# Patient Record
Sex: Female | Born: 1970 | Race: Black or African American | Hispanic: No | Marital: Married | Smoking: Current every day smoker
Health system: Southern US, Community
[De-identification: ages and names within clinical notes are randomized; demographics above are authoritative.]

## PROBLEM LIST (undated history)

## (undated) DIAGNOSIS — D649 Anemia, unspecified: Secondary | ICD-10-CM

## (undated) DIAGNOSIS — G43909 Migraine, unspecified, not intractable, without status migrainosus: Secondary | ICD-10-CM

## (undated) DIAGNOSIS — K219 Gastro-esophageal reflux disease without esophagitis: Secondary | ICD-10-CM

## (undated) DIAGNOSIS — Z973 Presence of spectacles and contact lenses: Secondary | ICD-10-CM

## (undated) DIAGNOSIS — R011 Cardiac murmur, unspecified: Secondary | ICD-10-CM

## (undated) DIAGNOSIS — I10 Essential (primary) hypertension: Secondary | ICD-10-CM

## (undated) DIAGNOSIS — K561 Intussusception: Secondary | ICD-10-CM

## (undated) DIAGNOSIS — E119 Type 2 diabetes mellitus without complications: Secondary | ICD-10-CM

## (undated) DIAGNOSIS — G4730 Sleep apnea, unspecified: Secondary | ICD-10-CM

## (undated) DIAGNOSIS — F419 Anxiety disorder, unspecified: Secondary | ICD-10-CM

## (undated) HISTORY — DX: Anxiety disorder, unspecified: F41.9

## (undated) HISTORY — PX: CHOLECYSTECTOMY: SHX55

## (undated) HISTORY — PX: TUBAL LIGATION: SHX77

## (undated) HISTORY — PX: APPENDECTOMY: SHX54

## (undated) HISTORY — DX: Cardiac murmur, unspecified: R01.1

---

## 2000-11-29 ENCOUNTER — Encounter: Payer: Self-pay | Admitting: Emergency Medicine

## 2000-11-29 ENCOUNTER — Emergency Department (HOSPITAL_COMMUNITY): Admission: EM | Admit: 2000-11-29 | Discharge: 2000-11-29 | Payer: Self-pay | Admitting: Emergency Medicine

## 2001-01-31 ENCOUNTER — Encounter: Admission: RE | Admit: 2001-01-31 | Discharge: 2001-02-21 | Payer: Self-pay | Admitting: *Deleted

## 2004-11-18 ENCOUNTER — Ambulatory Visit: Payer: Self-pay | Admitting: Gastroenterology

## 2005-03-08 ENCOUNTER — Ambulatory Visit: Payer: Self-pay | Admitting: Family Medicine

## 2005-07-18 ENCOUNTER — Ambulatory Visit: Payer: Self-pay | Admitting: Gastroenterology

## 2006-01-11 ENCOUNTER — Ambulatory Visit: Payer: Self-pay | Admitting: Oncology

## 2006-01-30 LAB — CBC WITH DIFFERENTIAL (CANCER CENTER ONLY)
BASO#: 0.1 10*3/uL (ref 0.0–0.2)
EOS%: 2.2 % (ref 0.0–7.0)
HCT: 33.5 % — ABNORMAL LOW (ref 34.8–46.6)
HGB: 11 g/dL — ABNORMAL LOW (ref 11.6–15.9)
MCH: 24 pg — ABNORMAL LOW (ref 26.0–34.0)
MCHC: 32.9 g/dL (ref 32.0–36.0)
MONO%: 3.9 % (ref 0.0–13.0)
NEUT%: 59.7 % (ref 39.6–80.0)

## 2006-01-30 LAB — LACTATE DEHYDROGENASE: LDH: 117 U/L (ref 94–250)

## 2006-01-30 LAB — FERRITIN: Ferritin: 6 ng/mL — ABNORMAL LOW (ref 10–291)

## 2006-01-30 LAB — MORPHOLOGY - CHCC SATELLITE: PLT EST ~~LOC~~: ADEQUATE

## 2006-01-30 LAB — COMPREHENSIVE METABOLIC PANEL
ALT: 12 U/L (ref 0–40)
Albumin: 3.7 g/dL (ref 3.5–5.2)
Chloride: 108 mEq/L (ref 96–112)
Total Protein: 6.3 g/dL (ref 6.0–8.3)

## 2006-01-30 LAB — IRON AND TIBC: Iron: 18 ug/dL — ABNORMAL LOW (ref 42–145)

## 2006-02-20 LAB — CBC WITH DIFFERENTIAL (CANCER CENTER ONLY)
BASO#: 0.1 10*3/uL (ref 0.0–0.2)
EOS%: 1.9 % (ref 0.0–7.0)
HCT: 35 % (ref 34.8–46.6)
HGB: 11 g/dL — ABNORMAL LOW (ref 11.6–15.9)
LYMPH#: 2.5 10*3/uL (ref 0.9–3.3)
LYMPH%: 24.6 % (ref 14.0–48.0)
MCHC: 31.5 g/dL — ABNORMAL LOW (ref 32.0–36.0)
MCV: 75 fL — ABNORMAL LOW (ref 81–101)
NEUT%: 68.9 % (ref 39.6–80.0)

## 2006-02-20 LAB — HEMOGLOBINOPATHY EVALUATION: Hgb F Quant: 0 % (ref 0.0–0.5)

## 2006-02-22 ENCOUNTER — Ambulatory Visit: Payer: Self-pay | Admitting: Gastroenterology

## 2006-02-26 ENCOUNTER — Ambulatory Visit: Payer: Self-pay | Admitting: Oncology

## 2006-03-13 LAB — CBC WITH DIFFERENTIAL (CANCER CENTER ONLY)
EOS%: 1.5 % (ref 0.0–7.0)
MCH: 25.2 pg — ABNORMAL LOW (ref 26.0–34.0)
MCHC: 32 g/dL (ref 32.0–36.0)
MONO%: 3.4 % (ref 0.0–13.0)
NEUT#: 6.3 10*3/uL (ref 1.5–6.5)
Platelets: 234 10*3/uL (ref 145–400)
RDW: 20.2 % — ABNORMAL HIGH (ref 10.5–14.6)

## 2006-03-13 LAB — IRON AND TIBC
%SAT: 21 % (ref 20–55)
Iron: 65 ug/dL (ref 42–145)
TIBC: 305 ug/dL (ref 250–470)
UIBC: 240 ug/dL

## 2006-03-13 LAB — FERRITIN: Ferritin: 121 ng/mL (ref 10–291)

## 2006-03-27 ENCOUNTER — Ambulatory Visit: Payer: Self-pay

## 2006-04-09 ENCOUNTER — Ambulatory Visit: Payer: Self-pay | Admitting: Oncology

## 2006-04-10 LAB — CBC WITH DIFFERENTIAL (CANCER CENTER ONLY)
BASO#: 0 10*3/uL (ref 0.0–0.2)
BASO%: 0.4 % (ref 0.0–2.0)
Eosinophils Absolute: 0.1 10*3/uL (ref 0.0–0.5)
HCT: 40.3 % (ref 34.8–46.6)
HGB: 12.9 g/dL (ref 11.6–15.9)
LYMPH#: 2.6 10*3/uL (ref 0.9–3.3)
MCV: 82 fL (ref 81–101)
MONO#: 0.3 10*3/uL (ref 0.1–0.9)
NEUT%: 59.9 % (ref 39.6–80.0)
RBC: 4.94 10*6/uL (ref 3.70–5.32)
RDW: 19.3 % — ABNORMAL HIGH (ref 10.5–14.6)
WBC: 7.6 10*3/uL (ref 3.9–10.0)

## 2006-04-11 LAB — FERRITIN: Ferritin: 76 ng/mL (ref 10–291)

## 2006-04-11 LAB — IRON AND TIBC: Iron: 92 ug/dL (ref 42–145)

## 2006-06-01 ENCOUNTER — Ambulatory Visit: Payer: Self-pay | Admitting: Oncology

## 2006-06-05 LAB — IRON AND TIBC
%SAT: 17 % — ABNORMAL LOW (ref 20–55)
Iron: 57 ug/dL (ref 42–145)
TIBC: 332 ug/dL (ref 250–470)
UIBC: 275 ug/dL

## 2006-06-05 LAB — CBC WITH DIFFERENTIAL (CANCER CENTER ONLY)
BASO%: 0.6 % (ref 0.0–2.0)
Eosinophils Absolute: 0.1 10*3/uL (ref 0.0–0.5)
LYMPH#: 2.7 10*3/uL (ref 0.9–3.3)
MONO#: 0.4 10*3/uL (ref 0.1–0.9)
NEUT#: 5.1 10*3/uL (ref 1.5–6.5)
Platelets: 235 10*3/uL (ref 145–400)
RBC: 4.78 10*6/uL (ref 3.70–5.32)
RDW: 13.1 % (ref 10.5–14.6)
WBC: 8.3 10*3/uL (ref 3.9–10.0)

## 2006-06-05 LAB — FERRITIN: Ferritin: 17 ng/mL (ref 10–291)

## 2006-06-28 ENCOUNTER — Ambulatory Visit: Payer: Self-pay

## 2006-08-27 ENCOUNTER — Ambulatory Visit: Payer: Self-pay | Admitting: Oncology

## 2006-08-28 LAB — COMPREHENSIVE METABOLIC PANEL
ALT: 14 U/L (ref 0–35)
AST: 14 U/L (ref 0–37)
Albumin: 4.2 g/dL (ref 3.5–5.2)
Alkaline Phosphatase: 61 U/L (ref 39–117)
BUN: 9 mg/dL (ref 6–23)
CO2: 20 mEq/L (ref 19–32)
Calcium: 8.8 mg/dL (ref 8.4–10.5)
Chloride: 110 mEq/L (ref 96–112)
Creatinine, Ser: 0.86 mg/dL (ref 0.40–1.20)
Glucose, Bld: 95 mg/dL (ref 70–99)
Potassium: 4.2 mEq/L (ref 3.5–5.3)
Sodium: 139 mEq/L (ref 135–145)
Total Bilirubin: 0.3 mg/dL (ref 0.3–1.2)
Total Protein: 6.6 g/dL (ref 6.0–8.3)

## 2006-08-28 LAB — FERRITIN: Ferritin: 31 ng/mL (ref 10–291)

## 2006-08-28 LAB — IRON AND TIBC
%SAT: 11 % — ABNORMAL LOW (ref 20–55)
Iron: 34 ug/dL — ABNORMAL LOW (ref 42–145)
TIBC: 296 ug/dL (ref 250–470)
UIBC: 262 ug/dL

## 2006-08-28 LAB — CBC WITH DIFFERENTIAL (CANCER CENTER ONLY)
BASO%: 0.7 % (ref 0.0–2.0)
Eosinophils Absolute: 0.1 10*3/uL (ref 0.0–0.5)
LYMPH%: 29.6 % (ref 14.0–48.0)
MCH: 28.3 pg (ref 26.0–34.0)
MCV: 85 fL (ref 81–101)
MONO%: 3.9 % (ref 0.0–13.0)
NEUT#: 5.6 10*3/uL (ref 1.5–6.5)
Platelets: 196 10*3/uL (ref 145–400)
RBC: 4.81 10*6/uL (ref 3.70–5.32)
RDW: 12.3 % (ref 10.5–14.6)
WBC: 8.8 10*3/uL (ref 3.9–10.0)

## 2006-09-05 DIAGNOSIS — D509 Iron deficiency anemia, unspecified: Secondary | ICD-10-CM | POA: Insufficient documentation

## 2006-09-05 DIAGNOSIS — K219 Gastro-esophageal reflux disease without esophagitis: Secondary | ICD-10-CM | POA: Insufficient documentation

## 2006-12-24 ENCOUNTER — Ambulatory Visit: Payer: Self-pay | Admitting: Oncology

## 2007-01-08 LAB — CBC WITH DIFFERENTIAL (CANCER CENTER ONLY)
Eosinophils Absolute: 0.1 10*3/uL (ref 0.0–0.5)
MCH: 30 pg (ref 26.0–34.0)
MONO#: 0.3 10*3/uL (ref 0.1–0.9)
MONO%: 3.1 % (ref 0.0–13.0)
NEUT#: 5.3 10*3/uL (ref 1.5–6.5)
Platelets: 186 10*3/uL (ref 145–400)
RBC: 4.8 10*6/uL (ref 3.70–5.32)
WBC: 8.4 10*3/uL (ref 3.9–10.0)

## 2007-01-08 LAB — IRON AND TIBC
%SAT: 28 % (ref 20–55)
Iron: 94 ug/dL (ref 42–145)

## 2007-04-01 ENCOUNTER — Ambulatory Visit: Payer: Self-pay | Admitting: Oncology

## 2007-04-26 LAB — CBC WITH DIFFERENTIAL (CANCER CENTER ONLY)
BASO#: 0 10*3/uL (ref 0.0–0.2)
EOS%: 1.5 % (ref 0.0–7.0)
Eosinophils Absolute: 0.1 10*3/uL (ref 0.0–0.5)
HCT: 43 % (ref 34.8–46.6)
HGB: 14.4 g/dL (ref 11.6–15.9)
MCH: 29.3 pg (ref 26.0–34.0)
MCHC: 33.4 g/dL (ref 32.0–36.0)
MCV: 88 fL (ref 81–101)
MONO%: 2.7 % (ref 0.0–13.0)
NEUT#: 5 10*3/uL (ref 1.5–6.5)
NEUT%: 63.1 % (ref 39.6–80.0)
RBC: 4.91 10*6/uL (ref 3.70–5.32)

## 2007-04-26 LAB — IRON AND TIBC
%SAT: 24 % (ref 20–55)
TIBC: 262 ug/dL (ref 250–470)
UIBC: 198 ug/dL

## 2007-04-26 LAB — FERRITIN: Ferritin: 74 ng/mL (ref 10–291)

## 2007-07-19 DIAGNOSIS — J309 Allergic rhinitis, unspecified: Secondary | ICD-10-CM | POA: Insufficient documentation

## 2007-10-21 ENCOUNTER — Ambulatory Visit: Payer: Self-pay | Admitting: Oncology

## 2008-03-26 ENCOUNTER — Emergency Department: Payer: Self-pay | Admitting: Emergency Medicine

## 2008-07-02 ENCOUNTER — Ambulatory Visit: Payer: Self-pay | Admitting: Gastroenterology

## 2008-07-28 ENCOUNTER — Emergency Department (HOSPITAL_COMMUNITY): Admission: EM | Admit: 2008-07-28 | Discharge: 2008-07-28 | Payer: Self-pay | Admitting: Emergency Medicine

## 2008-12-13 ENCOUNTER — Emergency Department (HOSPITAL_COMMUNITY): Admission: EM | Admit: 2008-12-13 | Discharge: 2008-12-13 | Payer: Self-pay | Admitting: Emergency Medicine

## 2010-02-02 ENCOUNTER — Ambulatory Visit: Payer: Self-pay | Admitting: Internal Medicine

## 2010-03-02 ENCOUNTER — Inpatient Hospital Stay: Payer: Self-pay | Admitting: Internal Medicine

## 2010-03-02 ENCOUNTER — Ambulatory Visit: Payer: Self-pay | Admitting: Cardiovascular Disease

## 2011-03-29 ENCOUNTER — Ambulatory Visit: Payer: Self-pay

## 2011-04-14 ENCOUNTER — Ambulatory Visit: Payer: Self-pay | Admitting: Internal Medicine

## 2012-06-14 ENCOUNTER — Ambulatory Visit: Payer: Self-pay | Admitting: Internal Medicine

## 2012-06-27 ENCOUNTER — Ambulatory Visit: Payer: Self-pay | Admitting: Internal Medicine

## 2012-11-25 ENCOUNTER — Ambulatory Visit: Payer: Self-pay | Admitting: Internal Medicine

## 2012-11-25 LAB — CREATININE, SERUM
Creatinine: 0.93 mg/dL (ref 0.60–1.30)
EGFR (African American): >60
EGFR (Non-African Amer.): >60

## 2013-07-23 ENCOUNTER — Ambulatory Visit: Payer: Self-pay

## 2013-09-12 ENCOUNTER — Ambulatory Visit: Payer: Self-pay | Admitting: Internal Medicine

## 2014-01-21 ENCOUNTER — Emergency Department: Payer: Self-pay

## 2014-01-21 LAB — BASIC METABOLIC PANEL
Anion Gap: 5 — ABNORMAL LOW (ref 7–16)
BUN: 10 mg/dL (ref 7–18)
Calcium, Total: 8.9 mg/dL (ref 8.5–10.1)
Chloride: 108 mmol/L — ABNORMAL HIGH (ref 98–107)
Co2: 25 mmol/L (ref 21–32)
Creatinine: 0.99 mg/dL (ref 0.60–1.30)
EGFR (African American): >60
EGFR (Non-African Amer.): >60
Glucose: 101 mg/dL — ABNORMAL HIGH (ref 65–99)
Osmolality: 275 (ref 275–301)
Potassium: 3.9 mmol/L (ref 3.5–5.1)
SODIUM: 138 mmol/L (ref 136–145)

## 2014-01-21 LAB — CBC
HCT: 42.8 % (ref 35.0–47.0)
HGB: 13.7 g/dL (ref 12.0–16.0)
MCH: 28.3 pg (ref 26.0–34.0)
MCHC: 31.9 g/dL — ABNORMAL LOW (ref 32.0–36.0)
MCV: 89 fL (ref 80–100)
Platelet: 199 10*3/uL (ref 150–440)
RBC: 4.82 10*6/uL (ref 3.80–5.20)
RDW: 13.5 % (ref 11.5–14.5)
WBC: 11.7 10*3/uL — ABNORMAL HIGH (ref 3.6–11.0)

## 2014-01-21 LAB — TROPONIN I: Troponin-I: <0.02 ng/mL

## 2014-01-27 ENCOUNTER — Ambulatory Visit: Payer: Self-pay | Admitting: Internal Medicine

## 2014-06-30 ENCOUNTER — Ambulatory Visit: Payer: Self-pay | Admitting: Internal Medicine

## 2014-10-02 ENCOUNTER — Emergency Department (HOSPITAL_COMMUNITY)
Admission: EM | Admit: 2014-10-02 | Discharge: 2014-10-02 | Disposition: A | Discharge status: Home / Self Care | Payer: BLUE CROSS/BLUE SHIELD | Attending: Emergency Medicine | Admitting: Emergency Medicine

## 2014-10-02 ENCOUNTER — Encounter (HOSPITAL_COMMUNITY): Payer: Self-pay

## 2014-10-02 ENCOUNTER — Emergency Department (HOSPITAL_COMMUNITY): Payer: BLUE CROSS/BLUE SHIELD

## 2014-10-02 DIAGNOSIS — M25512 Pain in left shoulder: Secondary | ICD-10-CM | POA: Diagnosis not present

## 2014-10-02 DIAGNOSIS — Z720 Tobacco use: Secondary | ICD-10-CM | POA: Insufficient documentation

## 2014-10-02 DIAGNOSIS — Z79899 Other long term (current) drug therapy: Secondary | ICD-10-CM | POA: Insufficient documentation

## 2014-10-02 DIAGNOSIS — K219 Gastro-esophageal reflux disease without esophagitis: Secondary | ICD-10-CM | POA: Diagnosis not present

## 2014-10-02 DIAGNOSIS — R202 Paresthesia of skin: Secondary | ICD-10-CM | POA: Diagnosis not present

## 2014-10-02 DIAGNOSIS — R9431 Abnormal electrocardiogram [ECG] [EKG]: Secondary | ICD-10-CM | POA: Diagnosis not present

## 2014-10-02 DIAGNOSIS — M542 Cervicalgia: Secondary | ICD-10-CM | POA: Diagnosis present

## 2014-10-02 HISTORY — DX: Gastro-esophageal reflux disease without esophagitis: K21.9

## 2014-10-02 HISTORY — DX: Intussusception: K56.1

## 2014-10-02 LAB — CBC WITH DIFFERENTIAL/PLATELET
BASOS ABS: 0 10*3/uL (ref 0.0–0.1)
Basophils Relative: 0 % (ref 0–1)
EOS ABS: 0.2 10*3/uL (ref 0.0–0.7)
Eosinophils Relative: 2 % (ref 0–5)
HEMATOCRIT: 41.3 % (ref 36.0–46.0)
HEMOGLOBIN: 13.2 g/dL (ref 12.0–15.0)
Lymphocytes Relative: 34 % (ref 12–46)
Lymphs Abs: 3 10*3/uL (ref 0.7–4.0)
MCH: 28.4 pg (ref 26.0–34.0)
MCHC: 32 g/dL (ref 30.0–36.0)
MCV: 88.8 fL (ref 78.0–100.0)
MONOS PCT: 5 % (ref 3–12)
Monocytes Absolute: 0.4 10*3/uL (ref 0.1–1.0)
NEUTROS ABS: 5.2 10*3/uL (ref 1.7–7.7)
Neutrophils Relative %: 59 % (ref 43–77)
Platelets: 187 10*3/uL (ref 150–400)
RBC: 4.65 MIL/uL (ref 3.87–5.11)
RDW: 13.4 % (ref 11.5–15.5)
WBC: 8.9 10*3/uL (ref 4.0–10.5)

## 2014-10-02 LAB — BASIC METABOLIC PANEL
Anion gap: 5 (ref 5–15)
BUN: 12 mg/dL (ref 6–23)
CO2: 23 mmol/L (ref 19–32)
Calcium: 8.7 mg/dL (ref 8.4–10.5)
Chloride: 111 mEq/L (ref 96–112)
Creatinine, Ser: 0.88 mg/dL (ref 0.50–1.10)
GFR calc Af Amer: >90 mL/min (ref >90)
GFR, EST NON AFRICAN AMERICAN: 78 mL/min — AB (ref >90)
Glucose, Bld: 109 mg/dL — ABNORMAL HIGH (ref 70–99)
Potassium: 3.8 mmol/L (ref 3.5–5.1)
Sodium: 139 mmol/L (ref 135–145)

## 2014-10-02 LAB — URINALYSIS, ROUTINE W REFLEX MICROSCOPIC
Bilirubin Urine: NEGATIVE
GLUCOSE, UA: NEGATIVE mg/dL
KETONES UR: NEGATIVE mg/dL
Leukocytes, UA: NEGATIVE
Nitrite: NEGATIVE
PH: 5.5 (ref 5.0–8.0)
Protein, ur: NEGATIVE mg/dL
Specific Gravity, Urine: >1.03 — ABNORMAL HIGH (ref 1.005–1.030)
UROBILINOGEN UA: 0.2 mg/dL (ref 0.0–1.0)

## 2014-10-02 LAB — URINE MICROSCOPIC-ADD ON

## 2014-10-02 LAB — TROPONIN I: Troponin I: <0.03 ng/mL (ref <0.031)

## 2014-10-02 NOTE — ED Provider Notes (Signed)
CSN: [id number]     Arrival date & time 10/02/14  [id number] History   First MD Initiated Contact with Patient 10/02/14 0359     Chief Complaint  Patient presents with  . Neck Pain  . Tingling     (Consider location/radiation/quality/duration/timing/severity/associated sxs/prior Treatment) HPI   Penny Myers is a 44 y.o. female who was awakened from sleep, about 2 hours ago, by left neck pain, which radiates to her left shoulder.  At the same time she noticed tingling in her left lower lip.  The tingling sensation also radiates to her left forehead, and left cheek.  She denies chest pain, weakness, dizziness.  She drove her vehicle here for evaluation.  She had a similar episode of discomfort both in her face, and her left shoulder, about 3 months ago.  She has had similar pain in her left shoulder for the last 2 days.  A friend of hers at work.  Recommended some shoulder exercises to do.  She has never had a comprehensive evaluation for the left shoulder pain.  She has never been told that she had a TIA or stroke.  She has not had any fever, chills, nausea, vomiting, weakness, or trouble walking.  There are no other known modifying factors.   Past Medical History  Diagnosis Date  . Acid reflux   . Intussusception of intestine    Past Surgical History  Procedure Laterality Date  . Cholecystectomy    . Appendectomy    . Tubal ligation     No family history on file. History  Substance Use Topics  . Smoking status: Current Every Day Smoker    Types: Cigarettes  . Smokeless tobacco: Not on file  . Alcohol Use: Yes   OB History    No data available     Review of Systems  All other systems reviewed and are negative.     Allergies  Review of patient's allergies indicates no known allergies.  Home Medications   Prior to Admission medications   Medication Sig Start Date End Date Taking? Authorizing Provider  ferrous fumarate-iron polysaccharide complex (TANDEM) 162-115.2 MG  CAPS Take 1 capsule by mouth daily with breakfast.   Yes Historical Provider, MD  omeprazole (PRILOSEC) 10 MG capsule Take 10 mg by mouth daily.   Yes Historical Provider, MD   BP 135/100 mmHg  Pulse 76  Temp(Src) 97.8 F (36.6 C) (Oral)  Resp 20  Ht 5\' 4"  (1.626 m)  Wt 201 lb (91.173 kg)  BMI 34.48 kg/m2  SpO2 98%  LMP 09/18/2014 Physical Exam  Constitutional: She is oriented to person, place, and time. She appears well-developed and well-nourished.  HENT:  Head: Normocephalic and atraumatic.  Right Ear: External ear normal.  Left Ear: External ear normal.  Eyes: Conjunctivae and EOM are normal. Pupils are equal, round, and reactive to light.  Neck: Normal range of motion and phonation normal. Neck supple.  Cardiovascular: Normal rate, regular rhythm and normal heart sounds.   Pulmonary/Chest: Effort normal and breath sounds normal. She exhibits no bony tenderness.  Abdominal: Soft. There is no tenderness.  Musculoskeletal: Normal range of motion.  Neurological: She is alert and oriented to person, place, and time. No cranial nerve deficit or sensory deficit. She exhibits normal muscle tone. Coordination normal.  No dysarthria and aphasia or nystagmus.  Normal gait, negative Romberg.  Normal finger-to-nose bilaterally.  No sensation of dysesthesia of the left face, with light touch.  No facial asymmetry.  Skin: Skin is  warm, dry and intact.  Psychiatric: She has a normal mood and affect. Her behavior is normal. Judgment and thought content normal.  Nursing note and vitals reviewed.   ED Course  Procedures (including critical care time)  04:15-Thrombolysis consideration- patient has a multitude of complaints, with the most concerning neurologic complaint being tingling left lower lip, and left face.  On clinical examination.  There is no dysesthesia of the left face.  I believe it is highly unlikely that she has an acute CVA; therefore, she will not be given thrombolysis at this  time.  Medications - No data to display  Patient Vitals for the past 24 hrs:  BP Temp Temp src Pulse Resp SpO2 Height Weight  10/02/14 0426 - 97.8 F (36.6 C) - - - - - -  10/02/14 0402 135/100 mmHg 97.8 F (36.6 C) Oral 76 20 98 %  (1.626 m) 201 lb (91.173 kg)    6:03 AM Reevaluation with update and discussion. After initial assessment and treatment, an updated evaluation reveals no change in clinical status.  She continues to complain of left shoulder pain, and it is worse with touch, and movement.  Findings discussed with patient, all questions answered. Penny Myers L    Labs Review Labs Reviewed  BASIC METABOLIC PANEL - Abnormal; Notable for the following:    Glucose, Bld 109 (*)    GFR calc non Af Amer 78 (*)    All other components within normal limits  URINALYSIS, ROUTINE W REFLEX MICROSCOPIC - Abnormal; Notable for the following:    Specific Gravity, Urine >1.030 (*)    Hgb urine dipstick SMALL (*)    All other components within normal limits  URINE MICROSCOPIC-ADD ON - Abnormal; Notable for the following:    Squamous Epithelial / LPF MANY (*)    Bacteria, UA FEW (*)    All other components within normal limits  TROPONIN I  CBC WITH DIFFERENTIAL    Imaging Review Dg Chest 2 View  10/02/2014   CLINICAL DATA:  Acute onset of left-sided neck and shoulder pain. Initial encounter.  EXAM: CHEST  2 VIEW  COMPARISON:  None.  FINDINGS: The lungs are well-aerated and clear. There is no evidence of focal opacification, pleural effusion or pneumothorax.  The heart is normal in size; the mediastinal contour is within normal limits. No acute osseous abnormalities are seen. Clips are noted within the right upper quadrant, reflecting prior cholecystectomy.  IMPRESSION: No acute cardiopulmonary process seen.   Electronically Signed   By: Roanna Raider M.D.   On: 10/02/2014 05:53   Ct Head Wo Contrast  10/02/2014   CLINICAL DATA:  Acute onset of left-sided neck pain and left  shoulder pain. Associated tingling. Initial encounter.  EXAM: CT HEAD WITHOUT CONTRAST  CT CERVICAL SPINE WITHOUT CONTRAST  TECHNIQUE: Multidetector CT imaging of the head and cervical spine was performed following the standard protocol without intravenous contrast. Multiplanar CT image reconstructions of the cervical spine were also generated.  COMPARISON:  None.  FINDINGS: CT HEAD FINDINGS  There is no evidence of acute infarction, mass lesion, or intra- or extra-axial hemorrhage on CT.  The posterior fossa, including the cerebellum, brainstem and fourth ventricle, is within normal limits. The third and lateral ventricles, and basal ganglia are unremarkable in appearance. The cerebral hemispheres are symmetric in appearance, with normal gray-white differentiation. No mass effect or midline shift is seen.  There is no evidence of fracture; visualized osseous structures are unremarkable in appearance. The visualized  portions of the orbits are within normal limits. The paranasal sinuses and mastoid air cells are well-aerated. No significant soft tissue abnormalities are seen.  CT CERVICAL SPINE FINDINGS  There is no evidence of fracture or subluxation. Vertebral bodies demonstrate normal height and alignment. Intervertebral disc spaces are preserved. Prevertebral soft tissues are within normal limits. The visualized neural foramina are grossly unremarkable.  The thyroid gland is mildly prominent, raising question for goiter. The visualized lung apices are clear. No significant soft tissue abnormalities are seen.  IMPRESSION: 1. No evidence of traumatic intracranial injury or fracture. 2. No evidence of fracture or subluxation along the cervical spine. 3. Mild prominence of the thyroid gland raises question for goiter. Would correlate with lab values.   Electronically Signed   By: Roanna Raider M.D.   On: 10/02/2014 05:49   Ct Cervical Spine Wo Contrast  10/02/2014   CLINICAL DATA:  Acute onset of left-sided neck  pain and left shoulder pain. Associated tingling. Initial encounter.  EXAM: CT HEAD WITHOUT CONTRAST  CT CERVICAL SPINE WITHOUT CONTRAST  TECHNIQUE: Multidetector CT imaging of the head and cervical spine was performed following the standard protocol without intravenous contrast. Multiplanar CT image reconstructions of the cervical spine were also generated.  COMPARISON:  None.  FINDINGS: CT HEAD FINDINGS  There is no evidence of acute infarction, mass lesion, or intra- or extra-axial hemorrhage on CT.  The posterior fossa, including the cerebellum, brainstem and fourth ventricle, is within normal limits. The third and lateral ventricles, and basal ganglia are unremarkable in appearance. The cerebral hemispheres are symmetric in appearance, with normal gray-white differentiation. No mass effect or midline shift is seen.  There is no evidence of fracture; visualized osseous structures are unremarkable in appearance. The visualized portions of the orbits are within normal limits. The paranasal sinuses and mastoid air cells are well-aerated. No significant soft tissue abnormalities are seen.  CT CERVICAL SPINE FINDINGS  There is no evidence of fracture or subluxation. Vertebral bodies demonstrate normal height and alignment. Intervertebral disc spaces are preserved. Prevertebral soft tissues are within normal limits. The visualized neural foramina are grossly unremarkable.  The thyroid gland is mildly prominent, raising question for goiter. The visualized lung apices are clear. No significant soft tissue abnormalities are seen.  IMPRESSION: 1. No evidence of traumatic intracranial injury or fracture. 2. No evidence of fracture or subluxation along the cervical spine. 3. Mild prominence of the thyroid gland raises question for goiter. Would correlate with lab values.   Electronically Signed   By: Roanna Raider M.D.   On: 10/02/2014 05:49     EKG Interpretation None      MDM   Final diagnoses:  Paresthesia   Left shoulder pain  Abnormal EKG    Unusual constellation of symptoms without a clear unifying diagnosis.  Evaluation for acute CVA, myocardial infarction, and spinal problems, are all negative.  Incidental finding of mild thyromegaly, is present.  Abnormal EKG, is encountered, but it does not indicate any acute coronary syndrome or suspected acute coronary abnormality.    Nursing Notes Reviewed/ Care Coordinated Applicable Imaging Reviewed Interpretation of Laboratory Data incorporated into ED treatment  The patient appears reasonably screened and/or stabilized for discharge and I doubt any other medical condition or other [facility] requiring further screening, evaluation, or treatment in the ED at this time prior to discharge.  Plan: Home Medications- OTC analgesia; Home Treatments- rest; return here if the recommended treatment, does not improve the symptoms; Recommended follow up-  PCP in 3 days as scheduled- Recommended to have Thyroid studies, ans arrange follow up for the abnormal EKG  Flint Melter, MD 10/02/14 [phone]

## 2014-10-02 NOTE — Discharge Instructions (Signed)
The testing today did not reveal any specific cause for your discomfort. You can try Tylenol, or Motrin, for your pain. You may try using heat on the left shoulder to see if that helps. CT scan showed some thyroid enlargement.  Have your doctor check thyroid blood test, to assess for problems associated with a goiter. The EKG today, was somewhat abnormal.  While this did not indicate a heart attack, it should be followed up on. Ask your primary care doctor to refer you to a cardiologist for a checkup, when you see her on Monday.    Arthralgia Your caregiver has diagnosed you as suffering from an arthralgia. Arthralgia means there is pain in a joint. This can come from many reasons including:  Bruising the joint which causes soreness (inflammation) in the joint.  Wear and tear on the joints which occur as we grow older (osteoarthritis).  Overusing the joint.  Various forms of arthritis.  Infections of the joint. Regardless of the cause of pain in your joint, most of these different pains respond to anti-inflammatory drugs and rest. The exception to this is when a joint is infected, and these cases are treated with antibiotics, if it is a bacterial infection. HOME CARE INSTRUCTIONS   Rest the injured area for as long as directed by your caregiver. Then slowly start using the joint as directed by your caregiver and as the pain allows. Crutches as directed may be useful if the ankles, knees or hips are involved. If the knee was splinted or casted, continue use and care as directed. If an stretchy or elastic wrapping bandage has been applied today, it should be removed and re-applied every 3 to 4 hours. It should not be applied tightly, but firmly enough to keep swelling down. Watch toes and feet for swelling, bluish discoloration, coldness, numbness or excessive pain. If any of these problems (symptoms) occur, remove the ace bandage and re-apply more loosely. If these symptoms persist, contact  your caregiver or return to this location.  For the first 24 hours, keep the injured extremity elevated on pillows while lying down.  Apply ice for 15-20 minutes to the sore joint every couple hours while awake for the first half day. Then 03-04 times per day for the first 48 hours. Put the ice in a plastic bag and place a towel between the bag of ice and your skin.  Wear any splinting, casting, elastic bandage applications, or slings as instructed.  Only take over-the-counter or prescription medicines for pain, discomfort, or fever as directed by your caregiver. Do not use aspirin immediately after the injury unless instructed by your physician. Aspirin can cause increased bleeding and bruising of the tissues.  If you were given crutches, continue to use them as instructed and do not resume weight bearing on the sore joint until instructed. Persistent pain and inability to use the sore joint as directed for more than 2 to 3 days are warning signs indicating that you should see a caregiver for a follow-up visit as soon as possible. Initially, a hairline fracture (break in bone) may not be evident on X-rays. Persistent pain and swelling indicate that further evaluation, non-weight bearing or use of the joint (use of crutches or slings as instructed), or further X-rays are indicated. X-rays may sometimes not show a small fracture until a week or 10 days later. Make a follow-up appointment with your own caregiver or one to whom we have referred you. A radiologist (specialist in reading X-rays) may  read your X-rays. Make sure you know how you are to obtain your X-ray results. Do not assume everything is normal if you do not hear from us. SEEK MEDICAL CARE IF: Bruising, swelling, or pain increases. SEEK IMMEDIATE MEDICAL CARE IF:   Your fingers or toes are numb or blue.  The pain is not responding to medications and continues to stay the same or get worse.  The pain in your joint becomes  severe.  You develop a fever over 102 F (38.9 C).  It becomes impossible to move or use the joint. MAKE SURE YOU:   Understand these instructions.  Will watch your condition.  Will get help right away if you are not doing well or get worse. Document Released: 09/04/2005 Document Revised: 11/27/2011 Document Reviewed: 04/22/2008 [facility] Patient Information 2015 [place], [place]. This information is not intended to replace advice given to you by your health care provider. Make sure you discuss any questions you have with your health care provider.

## 2014-10-02 NOTE — ED Notes (Signed)
Patient complaining of pain in the left side of neck, left shoulder, and an ache in my left arm. Had a problem with left shoulder and they gave me a shot. The PT on my job gave me some exercises to do with my shoulder this past week. Pain is radiating from the back of my neck into my left arm.

## 2014-10-02 NOTE — ED Notes (Signed)
Lower lip is tingling on left side.

## 2015-05-17 ENCOUNTER — Other Ambulatory Visit: Payer: Self-pay | Admitting: Internal Medicine

## 2015-05-17 DIAGNOSIS — Z1231 Encounter for screening mammogram for malignant neoplasm of breast: Secondary | ICD-10-CM

## 2015-07-02 ENCOUNTER — Ambulatory Visit
Admission: RE | Admit: 2015-07-02 | Discharge: 2015-07-02 | Disposition: A | Discharge status: Home / Self Care | Payer: BLUE CROSS/BLUE SHIELD | Source: Ambulatory Visit | Attending: Internal Medicine | Admitting: Internal Medicine

## 2015-07-02 DIAGNOSIS — F17200 Nicotine dependence, unspecified, uncomplicated: Secondary | ICD-10-CM | POA: Insufficient documentation

## 2015-07-02 DIAGNOSIS — R0789 Other chest pain: Secondary | ICD-10-CM | POA: Insufficient documentation

## 2015-07-02 DIAGNOSIS — Z1231 Encounter for screening mammogram for malignant neoplasm of breast: Secondary | ICD-10-CM | POA: Diagnosis not present

## 2015-07-02 HISTORY — DX: Other chest pain: R07.89

## 2015-09-27 ENCOUNTER — Encounter: Payer: Self-pay | Admitting: Emergency Medicine

## 2015-09-27 ENCOUNTER — Emergency Department: Payer: BLUE CROSS/BLUE SHIELD

## 2015-09-27 ENCOUNTER — Emergency Department
Admission: EM | Admit: 2015-09-27 | Discharge: 2015-09-27 | Disposition: A | Discharge status: Home / Self Care | Payer: BLUE CROSS/BLUE SHIELD | Attending: Emergency Medicine | Admitting: Emergency Medicine

## 2015-09-27 DIAGNOSIS — R42 Dizziness and giddiness: Secondary | ICD-10-CM | POA: Insufficient documentation

## 2015-09-27 DIAGNOSIS — M25512 Pain in left shoulder: Secondary | ICD-10-CM | POA: Diagnosis not present

## 2015-09-27 DIAGNOSIS — M549 Dorsalgia, unspecified: Secondary | ICD-10-CM

## 2015-09-27 DIAGNOSIS — Z79899 Other long term (current) drug therapy: Secondary | ICD-10-CM | POA: Diagnosis not present

## 2015-09-27 DIAGNOSIS — M546 Pain in thoracic spine: Secondary | ICD-10-CM | POA: Diagnosis not present

## 2015-09-27 DIAGNOSIS — F17210 Nicotine dependence, cigarettes, uncomplicated: Secondary | ICD-10-CM | POA: Insufficient documentation

## 2015-09-27 DIAGNOSIS — R0789 Other chest pain: Secondary | ICD-10-CM | POA: Diagnosis not present

## 2015-09-27 LAB — CBC
HCT: 42.3 % (ref 35.0–47.0)
Hemoglobin: 13.9 g/dL (ref 12.0–16.0)
MCH: 28.3 pg (ref 26.0–34.0)
MCHC: 32.8 g/dL (ref 32.0–36.0)
MCV: 86.4 fL (ref 80.0–100.0)
PLATELETS: 210 10*3/uL (ref 150–440)
RBC: 4.9 MIL/uL (ref 3.80–5.20)
RDW: 13.8 % (ref 11.5–14.5)
WBC: 10 10*3/uL (ref 3.6–11.0)

## 2015-09-27 LAB — BASIC METABOLIC PANEL
ANION GAP: 5 (ref 5–15)
BUN: 12 mg/dL (ref 6–20)
CALCIUM: 9.2 mg/dL (ref 8.9–10.3)
CO2: 25 mmol/L (ref 22–32)
Chloride: 110 mmol/L (ref 101–111)
Creatinine, Ser: 0.87 mg/dL (ref 0.44–1.00)
GFR calc Af Amer: >60 mL/min (ref >60)
GFR calc non Af Amer: >60 mL/min (ref >60)
Glucose, Bld: 100 mg/dL — ABNORMAL HIGH (ref 65–99)
POTASSIUM: 4 mmol/L (ref 3.5–5.1)
Sodium: 140 mmol/L (ref 135–145)

## 2015-09-27 LAB — TROPONIN I: Troponin I: <0.03 ng/mL (ref <0.031)

## 2015-09-27 MED ORDER — MORPHINE SULFATE (PF) 2 MG/ML IV SOLN
2.0000 mg | Freq: Once | INTRAVENOUS | Status: AC
  Administered 2015-09-27: 2 mg via INTRAVENOUS
  Filled 2015-09-27: qty 1

## 2015-09-27 MED ORDER — MORPHINE SULFATE (PF) 4 MG/ML IV SOLN: 4.0000 mg | Freq: Once | INTRAVENOUS | Status: DC

## 2015-09-27 MED ORDER — ASPIRIN 81 MG PO CHEW
162.0000 mg | CHEWABLE_TABLET | Freq: Once | ORAL | Status: AC
  Administered 2015-09-27: 162 mg via ORAL

## 2015-09-27 MED ORDER — OXYCODONE-ACETAMINOPHEN 5-325 MG PO TABS: 1.0000 | ORAL_TABLET | Freq: Four times a day (QID) | ORAL | Status: DC | PRN

## 2015-09-27 MED ORDER — OXYCODONE-ACETAMINOPHEN 5-325 MG PO TABS
1.0000 | ORAL_TABLET | Freq: Once | ORAL | Status: AC
  Administered 2015-09-27: 1 via ORAL
  Filled 2015-09-27: qty 1

## 2015-09-27 MED ORDER — IOHEXOL 350 MG/ML SOLN
75.0000 mL | Freq: Once | INTRAVENOUS | Status: AC | PRN
  Administered 2015-09-27: 75 mL via INTRAVENOUS

## 2015-09-27 MED ORDER — ONDANSETRON 4 MG PO TBDP
4.0000 mg | ORAL_TABLET | Freq: Once | ORAL | Status: AC
  Administered 2015-09-27: 4 mg via ORAL
  Filled 2015-09-27: qty 1

## 2015-09-27 MED ORDER — KETOROLAC TROMETHAMINE 30 MG/ML IJ SOLN
30.0000 mg | Freq: Once | INTRAMUSCULAR | Status: AC
  Administered 2015-09-27: 30 mg via INTRAVENOUS
  Filled 2015-09-27: qty 1

## 2015-09-27 MED ORDER — ASPIRIN 81 MG PO CHEW
CHEWABLE_TABLET | ORAL | Status: AC
  Administered 2015-09-27: 162 mg via ORAL
  Filled 2015-09-27: qty 2

## 2015-09-27 NOTE — ED Notes (Signed)
Pt states she is very dizzy, almost like she could pass out.

## 2015-09-27 NOTE — ED Notes (Signed)
Pt in no acute distress. Pt reports indigestion, MD aware.

## 2015-09-27 NOTE — Discharge Instructions (Signed)
You have been seen in the Emergency Department (ED) today for chest pain.   Please follow up with Dr. Welton FlakesKhan Tomorrow at 10AM as instructed aboveregarding todays emergent visit and your recent symptoms to discuss further management.  Continue to take your regular medications. If you are not doing so already, please also take a daily baby aspirin (81 mg), at least until you follow up with your doctor.  Return to the Emergency Department (ED) if you experience any further chest pain/pressure/tightness, difficulty breathing, or sudden sweating, or other symptoms that concern you.

## 2015-09-27 NOTE — ED Notes (Signed)
In lobby c/o worsening left shoulder blade pain up to her head. Vitals reassessed and better than previous. Pt tearful in triage.

## 2015-09-27 NOTE — ED Notes (Signed)
Pt to ed with c/o chest pain that started last night, states burning and tightness and left side of face pain and left chest pain.  Pt reports nausea, weakness and dizziness and back pain associated with pain.

## 2015-09-27 NOTE — ED Provider Notes (Signed)
[facility] Emergency Department Provider Note REMINDER - THIS NOTE IS NOT A FINAL MEDICAL RECORD UNTIL IT IS SIGNED. UNTIL THEN, THE CONTENT BELOW MAY REFLECT INFORMATION FROM A DOCUMENTATION TEMPLATE, NOT THE ACTUAL PATIENT VISIT. ____________________________________________  Time seen: Approximately 5:12 PM  I have reviewed the triage vital signs and the nursing notes.   HISTORY  Chief Complaint Chest Pain    HPI Penny Myers is a 45 y.o. female history of acid reflux and a previous intussusception Patient presents for evaluation of chest discomfort which she describes a burning feeling across left side of the chest that also feels like it shoots towards her left shoulder. Somewhat sharp, it is worsened with deep breathing, associated with a feeling of tightness and achiness in the muscles of the left part of her back. It started at rest yesterday while using the computer just after picking up her laundry basket and she questions if it could be muscular possible strain around the muscles in the left back. She denies any shortness of breath. No fevers chills. Denies abdominal pain. She has had a gallbladder and appendix removed previously.  Salicylate tubal ligation is not pregnant today. She denies any numbness tingling weakness in the arms or legs.She has occasionally felt a little bit dizzy throughout the day.  She does report that she has had evaluation with cardiology last year she had evaluation with Dr. Park Breed. She initially called him for follow-up today but was unable be seen in the office being closed.   Past Medical History  Diagnosis Date  . Acid reflux   . 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HCC)     There are no active problems to display for this patient.   Past Surgical History  Procedure Laterality Date  . Cholecystectomy    . Appendectomy    . Tubal ligation      Current Outpatient Rx  Name  Route  Sig  Dispense  Refill  . ferrous  fumarate-iron polysaccharide complex (TANDEM) 162-115.2 MG CAPS   Oral   Take 1 capsule by mouth daily with breakfast.         . omeprazole (PRILOSEC) 10 MG capsule   Oral   Take 10 mg by mouth daily.         Marland Kitchen oxyCODONE-acetaminophen (ROXICET) 5-325 MG tablet   Oral   Take 1 tablet by mouth every 6 (six) hours as needed for severe pain.   10 tablet   0     Allergies Review of patient's allergies indicates no known allergies.  Family History  Problem Relation Age of Onset  . Breast cancer Maternal Aunt   . Gastric cancer Maternal Grandmother     Social History Social History  Substance Use Topics  . Smoking status: Current Every Day Smoker    Types: Cigarettes  . Smokeless tobacco: None  . Alcohol Use: Yes    Review of Systems Constitutional: No fever/chills Eyes: No visual changes. ENT: No sore throat. Cardiovascular: See history of present illness Respiratory: Denies shortness of breath. Gastrointestinal: No abdominal pain.  No nausea, no vomiting.  No diarrhea.  No constipation. Genitourinary: Negative for dysuria. Musculoskeletal: Negative for back pain. Skin: Negative for rash. Neurological: Negative for headaches, focal weakness or numbness.  10-point ROS otherwise negative.  ____________________________________________   PHYSICAL EXAM:  VITAL SIGNS: ED Triage Vitals  Enc Vitals Group     BP 09/27/15 1235 144/83 mmHg     Pulse Rate 09/27/15 1235 73  Resp 09/27/15 1235 18     Temp 09/27/15 1235 98.2 F (36.8 C)     Temp Source 09/27/15 1235 Oral     SpO2 09/27/15 1235 99 %     Weight 09/27/15 1235 209 lb (94.802 kg)     Height 09/27/15 1235 5\' 4"  (1.626 m)     Head Cir --      Peak Flow --      Pain Score 09/27/15 1221 7     Pain Loc --      Pain Edu? --      Excl. in GC? --    Constitutional: Alert and oriented. Well appearing and in no acute distress. Eyes: Conjunctivae are normal. PERRL. EOMI. Head: Atraumatic. Nose: No  congestion/rhinnorhea. Mouth/Throat: Mucous membranes are moist.  Oropharynx non-erythematous. Neck: No stridor.  No carotid bruits. She does have tenderness across the left trapezius muscle. Chest point tenderness over the left upper back and around the area of the left scapula. This does reproduce her tenderness and pain across the left side, but she also does report a burning sensation of the pain radiates around the left chest. Cardiovascular: Normal rate, regular rhythm. Grossly normal heart sounds.  Good peripheral circulation. Respiratory: Normal respiratory effort.  No retractions. Lungs CTAB. Gastrointestinal: Soft and nontender. No distention. No abdominal bruits. No CVA tenderness. Musculoskeletal: Patient's pain is worsened when she lifts the left arm. She does have tenderness across the left trapezius muscle. No pronator drift. Median ulnar and radial nerves intact. Good peripheral pulses. No lower extremity tenderness nor edema.  No joint effusions. Neurologic:  Normal cranial nerve exam. Normal speech and language. No gross focal neurologic deficits are appreciated. No gait instability. Skin:  Skin is warm, dry and intact. No rash noted. Psychiatric: Mood and affect are normal. Speech and behavior are normal.  ____________________________________________   LABS (all labs ordered are listed, but only abnormal results are displayed)  Labs Reviewed  BASIC METABOLIC PANEL - Abnormal; Notable for the following:    Glucose, Bld 100 (*)    All other components within normal limits  TROPONIN I  CBC  TROPONIN I   ____________________________________________  EKG  Reviewed and interpreted by me at 1 PM Ventricular rate 81 QTc 440 QRS 100 PR 180 There is nonspecific T-wave abnormality and some mild inversions noted in V4, V3 and an incomplete right bundle appearance in V1 and V2 and V3 with expected T-wave abnormalities. Compared to previous EKG I do not believe this represents  an acute abnormality. ____________________________________________  RADIOLOGY  CT Angio Chest PE W/Cm &/Or Wo Cm (Final result) Result time: 09/27/15 18:55:30   Final result by Rad Results In Interface (09/27/15 18:55:30)   Narrative:   CLINICAL DATA: Chest pain starting last night with burning and tightness on the left side. Nausea, weakness, dizziness, and back pain.  EXAM: CT ANGIOGRAPHY CHEST WITH CONTRAST  TECHNIQUE: Multidetector CT imaging of the chest was performed using the standard protocol during bolus administration of intravenous contrast. Multiplanar CT image reconstructions and MIPs were obtained to evaluate the vascular anatomy.  CONTRAST: 75mL OMNIPAQUE IOHEXOL 350 MG/ML SOLN  COMPARISON: 09/27/2015 chest radiograph  FINDINGS: Mediastinum/Nodes: No filling defect is identified in the pulmonary arterial tree to suggest pulmonary embolus. No aortic dissection or acute aortic findings. Mild cardiomegaly. No pericardial effusion. Esophagus unremarkable. No pathologic thoracic adenopathy.  Lungs/Pleura: Bandlike scarring in both lower lobes, right greater than left, similar pattern to 10/30 09/2004. Right lower lobe noncalcified pulmonary nodule  0.6 by 0.4 cm, image 94 series 6. Left lower lobe subpleural nodule measuring at 0.7 by 0.5 cm, image 123 series 6.  Upper abdomen: Cholecystectomy.  Musculoskeletal: Unremarkable  Review of the MIP images confirms the above findings.  IMPRESSION: 1. No embolus or acute aortic findings. 2. Scarring at both lung bases, right greater than left. 3. Average size 5 mm right lower lobe pulmonary nodule, image 94 series 6, unfortunately this vertical level a is not included on the prior exam from 07/18/2005. If the patient is at high risk for bronchogenic carcinoma, follow-up chest CT at 6-12 months is recommended. If the patient is at low risk for bronchogenic carcinoma, follow-up chest CT at 12 months is  recommended. This recommendation follows the consensus statement: Guidelines for Management of Small Pulmonary Nodules Detected on CT Scans: A Statement from the Fleischner Society as published in Radiology 2005;237:395-400. 4. 6 mm average size left lower lobe subpleural pulmonary nodule. No appreciable change from 07/18/2005 hence benign.    ____________________________________________   PROCEDURES  Procedure(s) performed: None  Critical Care performed: No  ____________________________________________   INITIAL IMPRESSION / ASSESSMENT AND PLAN / ED COURSE  Pertinent labs & imaging results that were available during my care of the patient were reviewed by me and considered in my medical decision making (see chart for details).  Patient presents with left-sided chest and neck pain. Appears to be most likely most skeletal nature and is atypical of acute coronary syndrome. She reports that cardiology, Dr. Park Breed, I perform CT scan and stress testing about one year ago and was told the results are normal. She has a history of smoking, she EKG does not demonstrate any obvious acute changes when compared with previous. CT of the chest is reassuring with no pulmonary embolism, though some nodules are noted. I discussed the CT nodules with Dr. Park Breed whom will be following up with her 10 AM tomorrow.  Case and this goes with Dr. Park Breed her cardiologist who advises she is low risk and with two negative troponins and her EKG without clear ischemic changes would advise discharge. Dr. Park Breed will see her at 10 AM tomorrow. Discussed with the patient she is agreeable. She does feel much better after receiving Toradol and morphine in the ER, she is resting quite comfortably. I do believe that discharge is appropriate. No evidence suggest acute stroke, dissection, pulmonary embolism, acute coronary syndrome and based on atypical symptoms and exam suspect most likely musculoskeletal though she will follow-up  closely with her primary service and cardiology via Dr. Magdalen Spatz tomorrow. ____________________________________________   FINAL CLINICAL IMPRESSION(S) / ED DIAGNOSES  Final diagnoses:  Left-sided back pain  Chest pain, atypical   ----------------------------------------- 10:22 PM on 09/27/2015 -----------------------------------------  We called and discussed with the patient that she also does have a small nodule noted in the right lung. I also discussed with Dr. Park Breed who reports that they'll follow-up appropriately via CT in his office at the requisite timeframe around 6 months. Patient is aware and agreeable, she presently is feeling well and is home and is planning to see Dr. Park Breed at 10 AM tomorrow.   Sharyn Creamer, MD 09/27/15 2222

## 2015-09-28 DIAGNOSIS — D1430 Benign neoplasm of unspecified bronchus and lung: Secondary | ICD-10-CM | POA: Insufficient documentation

## 2015-09-28 DIAGNOSIS — I10 Essential (primary) hypertension: Secondary | ICD-10-CM | POA: Insufficient documentation

## 2015-09-28 DIAGNOSIS — Z809 Family history of malignant neoplasm, unspecified: Secondary | ICD-10-CM | POA: Insufficient documentation

## 2015-09-28 HISTORY — DX: Family history of malignant neoplasm, unspecified: Z80.9

## 2015-12-02 ENCOUNTER — Encounter (HOSPITAL_BASED_OUTPATIENT_CLINIC_OR_DEPARTMENT_OTHER): Payer: Self-pay | Admitting: *Deleted

## 2015-12-06 ENCOUNTER — Other Ambulatory Visit: Payer: Self-pay | Admitting: Physician Assistant
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
## 2015-12-09 ENCOUNTER — Ambulatory Visit (HOSPITAL_BASED_OUTPATIENT_CLINIC_OR_DEPARTMENT_OTHER): Payer: BLUE CROSS/BLUE SHIELD | Admitting: Anesthesiology

## 2015-12-09 ENCOUNTER — Encounter (HOSPITAL_BASED_OUTPATIENT_CLINIC_OR_DEPARTMENT_OTHER): Payer: Self-pay | Admitting: Anesthesiology

## 2015-12-09 ENCOUNTER — Encounter (HOSPITAL_BASED_OUTPATIENT_CLINIC_OR_DEPARTMENT_OTHER)
Admission: RE | Disposition: A | Discharge status: Home / Self Care | Payer: Self-pay | Source: Ambulatory Visit | Attending: Orthopedic Surgery

## 2015-12-09 ENCOUNTER — Ambulatory Visit (HOSPITAL_BASED_OUTPATIENT_CLINIC_OR_DEPARTMENT_OTHER)
Admission: RE | Admit: 2015-12-09 | Discharge: 2015-12-09 | Disposition: A | Discharge status: Home / Self Care | Payer: BLUE CROSS/BLUE SHIELD | Source: Ambulatory Visit | Attending: Orthopedic Surgery | Admitting: Orthopedic Surgery

## 2015-12-09 DIAGNOSIS — I10 Essential (primary) hypertension: Secondary | ICD-10-CM | POA: Insufficient documentation

## 2015-12-09 DIAGNOSIS — M7542 Impingement syndrome of left shoulder: Secondary | ICD-10-CM | POA: Diagnosis not present

## 2015-12-09 DIAGNOSIS — M75102 Unspecified rotator cuff tear or rupture of left shoulder, not specified as traumatic: Secondary | ICD-10-CM | POA: Insufficient documentation

## 2015-12-09 DIAGNOSIS — F17200 Nicotine dependence, unspecified, uncomplicated: Secondary | ICD-10-CM | POA: Insufficient documentation

## 2015-12-09 DIAGNOSIS — K219 Gastro-esophageal reflux disease without esophagitis: Secondary | ICD-10-CM | POA: Insufficient documentation

## 2015-12-09 DIAGNOSIS — M19012 Primary osteoarthritis, left shoulder: Secondary | ICD-10-CM | POA: Diagnosis not present

## 2015-12-09 HISTORY — DX: Essential (primary) hypertension: I10

## 2015-12-09 HISTORY — PX: SHOULDER ARTHROSCOPY WITH SUBACROMIAL DECOMPRESSION: SHX5684

## 2015-12-09 LAB — POCT HEMOGLOBIN-HEMACUE: Hemoglobin: 14.2 g/dL (ref 12.0–15.0)

## 2015-12-09 SURGERY — SHOULDER ARTHROSCOPY WITH SUBACROMIAL DECOMPRESSION
Anesthesia: General | Site: Shoulder | Laterality: Left

## 2015-12-09 MED ORDER — METOCLOPRAMIDE HCL 5 MG/ML IJ SOLN: 5.0000 mg | Freq: Three times a day (TID) | INTRAMUSCULAR | Status: DC | PRN

## 2015-12-09 MED ORDER — MEPERIDINE HCL 25 MG/ML IJ SOLN: 6.2500 mg | INTRAMUSCULAR | Status: DC | PRN

## 2015-12-09 MED ORDER — HYDROMORPHONE HCL 1 MG/ML IJ SOLN
0.2500 mg | INTRAMUSCULAR | Status: DC | PRN
  Administered 2015-12-09: 0.5 mg via INTRAVENOUS

## 2015-12-09 MED ORDER — SUCCINYLCHOLINE CHLORIDE 20 MG/ML IJ SOLN
INTRAMUSCULAR | Status: AC
  Filled 2015-12-09: qty 1

## 2015-12-09 MED ORDER — DEXAMETHASONE SODIUM PHOSPHATE 4 MG/ML IJ SOLN
INTRAMUSCULAR | Status: DC | PRN
  Administered 2015-12-09: 10 mg via INTRAVENOUS

## 2015-12-09 MED ORDER — SCOPOLAMINE 1 MG/3DAYS TD PT72: 1.0000 | MEDICATED_PATCH | Freq: Once | TRANSDERMAL | Status: DC | PRN

## 2015-12-09 MED ORDER — ONDANSETRON HCL 4 MG PO TABS: 4.0000 mg | ORAL_TABLET | Freq: Three times a day (TID) | ORAL | Status: DC | PRN

## 2015-12-09 MED ORDER — LACTATED RINGERS IV SOLN: INTRAVENOUS | Status: DC

## 2015-12-09 MED ORDER — DEXTROSE 5 % IV SOLN
2.0000 g | INTRAVENOUS | Status: AC
  Administered 2015-12-09: 2 g via INTRAVENOUS

## 2015-12-09 MED ORDER — DEXAMETHASONE SODIUM PHOSPHATE 10 MG/ML IJ SOLN
INTRAMUSCULAR | Status: AC
  Filled 2015-12-09: qty 1

## 2015-12-09 MED ORDER — PROPOFOL 10 MG/ML IV BOLUS
INTRAVENOUS | Status: DC | PRN
  Administered 2015-12-09: 150 mg via INTRAVENOUS

## 2015-12-09 MED ORDER — SUCCINYLCHOLINE CHLORIDE 20 MG/ML IJ SOLN
INTRAMUSCULAR | Status: DC | PRN
  Administered 2015-12-09: 120 mg via INTRAVENOUS

## 2015-12-09 MED ORDER — ONDANSETRON HCL 4 MG/2ML IJ SOLN
INTRAMUSCULAR | Status: DC | PRN
  Administered 2015-12-09: 4 mg via INTRAVENOUS

## 2015-12-09 MED ORDER — HYDROMORPHONE HCL 1 MG/ML IJ SOLN
INTRAMUSCULAR | Status: AC
  Filled 2015-12-09: qty 1

## 2015-12-09 MED ORDER — CHLORHEXIDINE GLUCONATE 4 % EX LIQD: 60.0000 mL | Freq: Once | CUTANEOUS | Status: DC

## 2015-12-09 MED ORDER — FENTANYL CITRATE (PF) 100 MCG/2ML IJ SOLN
50.0000 ug | INTRAMUSCULAR | Status: DC | PRN
  Administered 2015-12-09: 100 ug via INTRAVENOUS
  Administered 2015-12-09: 50 ug via INTRAVENOUS

## 2015-12-09 MED ORDER — MIDAZOLAM HCL 2 MG/2ML IJ SOLN
INTRAMUSCULAR | Status: AC
  Filled 2015-12-09: qty 2

## 2015-12-09 MED ORDER — OXYCODONE-ACETAMINOPHEN 5-325 MG PO TABS: 1.0000 | ORAL_TABLET | ORAL | Status: DC | PRN

## 2015-12-09 MED ORDER — ONDANSETRON HCL 4 MG PO TABS: 4.0000 mg | ORAL_TABLET | Freq: Four times a day (QID) | ORAL | Status: DC | PRN

## 2015-12-09 MED ORDER — DEXTROSE 5 % IV SOLN: 500.0000 mg | Freq: Four times a day (QID) | INTRAVENOUS | Status: DC | PRN

## 2015-12-09 MED ORDER — LIDOCAINE HCL (CARDIAC) 20 MG/ML IV SOLN
INTRAVENOUS | Status: AC
  Filled 2015-12-09: qty 5

## 2015-12-09 MED ORDER — FENTANYL CITRATE (PF) 100 MCG/2ML IJ SOLN
INTRAMUSCULAR | Status: AC
  Filled 2015-12-09: qty 2

## 2015-12-09 MED ORDER — ONDANSETRON HCL 4 MG/2ML IJ SOLN
INTRAMUSCULAR | Status: AC
  Filled 2015-12-09: qty 2

## 2015-12-09 MED ORDER — PROPOFOL 500 MG/50ML IV EMUL
INTRAVENOUS | Status: AC
  Filled 2015-12-09: qty 50

## 2015-12-09 MED ORDER — BUPIVACAINE-EPINEPHRINE (PF) 0.5% -1:200000 IJ SOLN
INTRAMUSCULAR | Status: DC | PRN
  Administered 2015-12-09: 30 mL via PERINEURAL

## 2015-12-09 MED ORDER — HYDROMORPHONE HCL 1 MG/ML IJ SOLN: 0.5000 mg | INTRAMUSCULAR | Status: DC | PRN

## 2015-12-09 MED ORDER — LACTATED RINGERS IV SOLN
INTRAVENOUS | Status: DC
  Administered 2015-12-09: 08:00:00 via INTRAVENOUS

## 2015-12-09 MED ORDER — LIDOCAINE HCL (CARDIAC) 20 MG/ML IV SOLN
INTRAVENOUS | Status: DC | PRN
  Administered 2015-12-09: 50 mg via INTRAVENOUS

## 2015-12-09 MED ORDER — ONDANSETRON HCL 4 MG/2ML IJ SOLN: 4.0000 mg | Freq: Four times a day (QID) | INTRAMUSCULAR | Status: DC | PRN

## 2015-12-09 MED ORDER — METOCLOPRAMIDE HCL 5 MG PO TABS: 5.0000 mg | ORAL_TABLET | Freq: Three times a day (TID) | ORAL | Status: DC | PRN

## 2015-12-09 MED ORDER — ONDANSETRON HCL 4 MG/2ML IJ SOLN: 4.0000 mg | Freq: Once | INTRAMUSCULAR | Status: DC | PRN

## 2015-12-09 MED ORDER — METHOCARBAMOL 500 MG PO TABS
500.0000 mg | ORAL_TABLET | Freq: Four times a day (QID) | ORAL | Status: DC | PRN
  Administered 2015-12-09: 500 mg via ORAL
  Filled 2015-12-09: qty 1

## 2015-12-09 MED ORDER — CEFAZOLIN SODIUM 1-5 GM-% IV SOLN
INTRAVENOUS | Status: AC
  Filled 2015-12-09: qty 50

## 2015-12-09 MED ORDER — MIDAZOLAM HCL 2 MG/2ML IJ SOLN
1.0000 mg | INTRAMUSCULAR | Status: DC | PRN
  Administered 2015-12-09: 2 mg via INTRAVENOUS

## 2015-12-09 MED ORDER — GLYCOPYRROLATE 0.2 MG/ML IJ SOLN: 0.2000 mg | Freq: Once | INTRAMUSCULAR | Status: DC | PRN

## 2015-12-09 SURGICAL SUPPLY — 78 items
BENZOIN TINCTURE PRP APPL 2/3 (GAUZE/BANDAGES/DRESSINGS) IMPLANT
BLADE CUTTER GATOR 3.5 (BLADE) ×4 IMPLANT
BLADE CUTTER MENIS 5.5 (BLADE) IMPLANT
BLADE GREAT WHITE 4.2 (BLADE) ×3 IMPLANT
BLADE GREAT WHITE 4.2MM (BLADE) ×1
BLADE MINI RND TIP GREEN BEAV (BLADE) IMPLANT
BLADE SURG 15 STRL LF DISP TIS (BLADE) IMPLANT
BLADE SURG 15 STRL SS (BLADE)
BUR OVAL 6.0 (BURR) ×4 IMPLANT
CANNULA 5.75X71 LONG (CANNULA) IMPLANT
CANNULA DRY DOC 8X75 (CANNULA) IMPLANT
CANNULA TWIST IN 8.25X7CM (CANNULA) IMPLANT
CANNULA TWIST IN 8.25X9CM (CANNULA) IMPLANT
CLOSURE WOUND 1/2 X4 (GAUZE/BANDAGES/DRESSINGS)
DECANTER SPIKE VIAL GLASS SM (MISCELLANEOUS) IMPLANT
DRAPE STERI 35X30 U-POUCH (DRAPES) ×4 IMPLANT
DRAPE U-SHAPE 47X51 STRL (DRAPES) ×4 IMPLANT
DRAPE U-SHAPE 76X120 STRL (DRAPES) ×8 IMPLANT
DRSG PAD ABDOMINAL 8X10 ST (GAUZE/BANDAGES/DRESSINGS) ×4 IMPLANT
DURAPREP 26ML APPLICATOR (WOUND CARE) ×4 IMPLANT
ELECT MENISCUS 165MM 90D (ELECTRODE) ×4 IMPLANT
ELECT REM PT RETURN 9FT ADLT (ELECTROSURGICAL) ×4
ELECTRODE REM PT RTRN 9FT ADLT (ELECTROSURGICAL) ×2 IMPLANT
GAUZE SPONGE 4X4 12PLY STRL (GAUZE/BANDAGES/DRESSINGS) ×4 IMPLANT
GAUZE SPONGE 4X4 16PLY XRAY LF (GAUZE/BANDAGES/DRESSINGS) IMPLANT
GAUZE XEROFORM 1X8 LF (GAUZE/BANDAGES/DRESSINGS) ×4 IMPLANT
GLOVE BIO SURGEON STRL SZ 6.5 (GLOVE) ×6 IMPLANT
GLOVE BIO SURGEONS STRL SZ 6.5 (GLOVE) ×2
GLOVE BIOGEL PI IND STRL 7.0 (GLOVE) ×8 IMPLANT
GLOVE BIOGEL PI INDICATOR 7.0 (GLOVE) ×8
GLOVE ECLIPSE 7.0 STRL STRAW (GLOVE) ×4 IMPLANT
GLOVE SURG ORTHO 8.0 STRL STRW (GLOVE) ×4 IMPLANT
GOWN STRL REUS W/ TWL LRG LVL3 (GOWN DISPOSABLE) ×4 IMPLANT
GOWN STRL REUS W/ TWL XL LVL3 (GOWN DISPOSABLE) ×2 IMPLANT
GOWN STRL REUS W/TWL LRG LVL3 (GOWN DISPOSABLE) ×4
GOWN STRL REUS W/TWL XL LVL3 (GOWN DISPOSABLE) ×2
IMMOBILIZER SHOULDER FOAM XLGE (SOFTGOODS) IMPLANT
KIT PUSHLOCK 2.9 HIP (KITS) IMPLANT
LASSO 90 CVE QUICKPAS (DISPOSABLE) ×4 IMPLANT
LOOP 2 FIBERLINK CLOSED (SUTURE) IMPLANT
MANIFOLD NEPTUNE II (INSTRUMENTS) ×4 IMPLANT
NDL SUT 6 .5 CRC .975X.05 MAYO (NEEDLE) IMPLANT
NEEDLE MAYO TAPER (NEEDLE)
NS IRRIG 1000ML POUR BTL (IV SOLUTION) IMPLANT
PACK ARTHROSCOPY DSU (CUSTOM PROCEDURE TRAY) ×4 IMPLANT
PACK BASIN DAY SURGERY FS (CUSTOM PROCEDURE TRAY) ×4 IMPLANT
PENCIL BUTTON HOLSTER BLD 10FT (ELECTRODE) ×4 IMPLANT
SET ARTHROSCOPY TUBING (MISCELLANEOUS) ×2
SET ARTHROSCOPY TUBING LN (MISCELLANEOUS) ×2 IMPLANT
SLEEVE SCD COMPRESS KNEE MED (MISCELLANEOUS) IMPLANT
SLING ARM FOAM STRAP LRG (SOFTGOODS) IMPLANT
SLING ARM IMMOBILIZER LRG (SOFTGOODS) IMPLANT
SLING ARM IMMOBILIZER MED (SOFTGOODS) IMPLANT
SLING ARM MED ADULT FOAM STRAP (SOFTGOODS) IMPLANT
SLING ARM XL FOAM STRAP (SOFTGOODS) IMPLANT
SPONGE LAP 4X18 X RAY DECT (DISPOSABLE) IMPLANT
STRIP CLOSURE SKIN 1/2X4 (GAUZE/BANDAGES/DRESSINGS) IMPLANT
SUCTION FRAZIER HANDLE 10FR (MISCELLANEOUS)
SUCTION TUBE FRAZIER 10FR DISP (MISCELLANEOUS) IMPLANT
SUT ETHIBOND 2 OS 4 DA (SUTURE) IMPLANT
SUT ETHILON 2 0 FS 18 (SUTURE) IMPLANT
SUT ETHILON 3 0 PS 1 (SUTURE) IMPLANT
SUT FIBERWIRE #2 38 T-5 BLUE (SUTURE)
SUT RETRIEVER MED (INSTRUMENTS) IMPLANT
SUT VIC AB 0 CT1 27 (SUTURE)
SUT VIC AB 0 CT1 27XBRD ANBCTR (SUTURE) IMPLANT
SUT VIC AB 2-0 SH 27 (SUTURE)
SUT VIC AB 2-0 SH 27XBRD (SUTURE) IMPLANT
SUT VIC AB 3-0 FS2 27 (SUTURE) IMPLANT
SUTURE FIBERWR #2 38 T-5 BLUE (SUTURE) IMPLANT
SYR BULB 3OZ (MISCELLANEOUS) IMPLANT
TAPE LABRALWHITE 1.5X36 (TAPE) IMPLANT
TAPE SUT LABRALTAP WHT/BLK (SUTURE) IMPLANT
TOWEL OR 17X24 6PK STRL BLUE (TOWEL DISPOSABLE) ×4 IMPLANT
TUBE CONNECTING 20'X1/4 (TUBING)
TUBE CONNECTING 20X1/4 (TUBING) IMPLANT
WATER STERILE IRR 1000ML POUR (IV SOLUTION) ×4 IMPLANT
YANKAUER SUCT BULB TIP NO VENT (SUCTIONS) IMPLANT

## 2015-12-09 NOTE — Progress Notes (Signed)
Assisted Dr. Michelle Piperssey with left, ultrasound guided interscaline block. Side rails up, monitors on throughout procedure. See vital signs in flow sheet. Tolerated Procedure well.

## 2015-12-09 NOTE — Anesthesia Procedure Notes (Addendum)
Anesthesia Regional Block:  Interscalene brachial plexus block  Pre-Anesthetic Checklist: ,, timeout performed, Correct Patient, Correct Site, Correct Laterality, Correct Procedure, Correct Position, site marked, Risks and benefits discussed,  Surgical consent,  Pre-op evaluation,  At surgeon's request and post-op pain management  Laterality: Left  Prep: chloraprep       Needles:  Injection technique: Single-shot  Needle Type: Echogenic Stimulator Needle     Needle Length: 9cm 9 cm Needle Gauge: 21 and 21 G    Additional Needles:  Procedures: ultrasound guided (picture in chart) and nerve stimulator Interscalene brachial plexus block  Nerve Stimulator or Paresthesia:  Response: 0.4 mA,   Additional Responses:   Narrative:  Start time: 12/09/2015 8:20 AM End time: 12/09/2015 8:30 AM Injection made incrementally with aspirations every 5 mL.  Performed by: Personally  Anesthesiologist: Arta BruceSSEY, KEVIN  Additional Notes: Monitors applied. Patient sedated. Sterile prep and drape,hand hygiene and sterile gloves were used. Relevant anatomy identified.Needle position confirmed.Local anesthetic injected incrementally after negative aspiration. Local anesthetic spread visualized around nerve(s). Vascular puncture avoided. No complications. Image printed for medical record.The patient tolerated the procedure well.        Procedure Name: Intubation Performed by: York GricePEARSON, Cherylene Ferrufino W Pre-anesthesia Checklist: Patient identified, Emergency Drugs available, Suction available and Patient being monitored Patient Re-evaluated:Patient Re-evaluated prior to inductionOxygen Delivery Method: Circle System Utilized Preoxygenation: Pre-oxygenation with 100% oxygen Intubation Type: IV induction Ventilation: Mask ventilation without difficulty Laryngoscope Size: Miller and 2 Grade View: Grade I Tube type: Oral Tube size: 7.0 mm Number of attempts: 1 Airway Equipment and Method: Stylet and Oral  airway Placement Confirmation: ETT inserted through vocal cords under direct vision,  positive ETCO2 and breath sounds checked- equal and bilateral Secured at: 22 cm Tube secured with: Tape Dental Injury: Teeth and Oropharynx as per pre-operative assessment

## 2015-12-09 NOTE — Transfer of Care (Signed)
Immediate Anesthesia Transfer of Care Note  Patient: Penny Myers  Procedure(s) Performed: Procedure(s): SHOULDER ARTHROSCOPY WITH SUBACROMIAL DECOMPRESSION (Left)  Patient Location: PACU  Anesthesia Type:General  Level of Consciousness: awake and sedated  Airway & Oxygen Therapy: Patient Spontanous Breathing and Patient connected to face mask oxygen  Post-op Assessment: Report given to RN and Post -op Vital signs reviewed and stable  Post vital signs: Reviewed and stable  Last Vitals:  Filed Vitals:   12/09/15 0835 12/09/15 0845  BP: 120/77   Pulse: 81 92  Temp:    Resp: 15 17    Complications: No apparent anesthesia complications

## 2015-12-09 NOTE — Anesthesia Preprocedure Evaluation (Signed)
Anesthesia Evaluation  Patient identified by MRN, date of birth, ID band Patient awake    Reviewed: Allergy & Precautions, NPO status , Patient's Chart, lab work & pertinent test results  Airway Mallampati: I  TM Distance: >3 FB Neck ROM: Full    Dental   Pulmonary Current Smoker,    Pulmonary exam normal        Cardiovascular hypertension, Pt. on medications Normal cardiovascular exam     Neuro/Psych    GI/Hepatic GERD  Medicated and Controlled,  Endo/Other    Renal/GU      Musculoskeletal   Abdominal   Peds  Hematology   Anesthesia Other Findings   Reproductive/Obstetrics                             Anesthesia Physical Anesthesia Plan  ASA: II  Anesthesia Plan: General   Post-op Pain Management: GA combined w/ Regional for post-op pain   Induction: Intravenous  Airway Management Planned: Oral ETT  Additional Equipment:   Intra-op Plan:   Post-operative Plan: Extubation in OR  Informed Consent: I have reviewed the patients History and Physical, chart, labs and discussed the procedure including the risks, benefits and alternatives for the proposed anesthesia with the patient or authorized representative who has indicated his/her understanding and acceptance.     Plan Discussed with: CRNA and Surgeon  Anesthesia Plan Comments:         Anesthesia Quick Evaluation

## 2015-12-09 NOTE — Discharge Instructions (Signed)
Shouder arthroscopy, partial rotator cuff tear debridement subacromial decompression °Care After Instructions °Refer to this sheet in the next few weeks. These discharge instructions provide you with general information on caring for yourself after you leave the hospital. Your caregiver may also give you specific instructions. Your treatment has been planned according to the most current medical practices available, but unavoidable complications sometimes occur. If you have any problems or questions after discharge, please call your caregiver. °HOME INSTRUCTIONS °You may resume a normal diet and activities as directed. Take showers instead of baths until informed otherwise.  °Change bandages (dressings) in 3 days.  Swab wounds daily with betadine.  Wash shoulder with soap and water.  Pat dry.  Cover wounds with bandaids. °Only take over-the-counter or prescription medicines for pain, discomfort, or fever as directed by your caregiver.  °Wear your sling for the next 2 days unless otherwise instructed. °Eat a well-balanced diet.  °Avoid lifting or driving until you are instructed otherwise.  °Make an appointment to see your caregiver for stitches (suture) or staple removal one week after surgery. ° °SEEK MEDICAL CARE IF: °You have swelling of your calf or leg.  °You develop shortness of breath or chest pain.  °You have redness, swelling, or increasing pain in the wound.  °There is pus or any unusual drainage coming from the surgical site.  °You notice a bad smell coming from the surgical site or dressing.  °The surgical site breaks open after sutures or staples have been removed.  °There is persistent bleeding from the suture or staple line.  °You are getting worse or are not improving.  °You have any other questions or concerns.  °SEEK IMMEDIATE MEDICAL CARE IF:  °You have a fever greater than 101 °You develop a rash.  °You have difficulty breathing.  °You develop any reaction or side effects to medicines given.    °Your knee motion is decreasing rather than improving.  °MAKE SURE YOU:  °Understand these instructions.  °Will watch your condition.  °Will get help right away if you are not doing well or get worse.  ° °Post Anesthesia Home Care Instructions ° °Activity: °Get plenty of rest for the remainder of the day. A responsible adult should stay with you for 24 hours following the procedure.  °For the next 24 hours, DO NOT: °-Drive a car °-Operate machinery °-Drink alcoholic beverages °-Take any medication unless instructed by your physician °-Make any legal decisions or sign important papers. ° °Meals: °Start with liquid foods such as gelatin or soup. Progress to regular foods as tolerated. Avoid greasy, spicy, heavy foods. If nausea and/or vomiting occur, drink only clear liquids until the nausea and/or vomiting subsides. Call your physician if vomiting continues. ° °Special Instructions/Symptoms: °Your throat may feel dry or sore from the anesthesia or the breathing tube placed in your throat during surgery. If this causes discomfort, gargle with warm salt water. The discomfort should disappear within 24 hours. ° °If you had a scopolamine patch placed behind your ear for the management of post- operative nausea and/or vomiting: ° °1. The medication in the patch is effective for 72 hours, after which it should be removed.  Wrap patch in a tissue and discard in the trash. Wash hands thoroughly with soap and water. °2. You may remove the patch earlier than 72 hours if you experience unpleasant side effects which may include dry mouth, dizziness or visual disturbances. °3. Avoid touching the patch. Wash your hands with soap and water after contact with   the patch. ° ° °  °Regional Anesthesia Blocks ° °1. Numbness or the inability to move the "blocked" extremity may last from 3-48 hours after placement. The length of time depends on the medication injected and your individual response to the medication. If the numbness is not  going away after 48 hours, call your surgeon. ° °2. The extremity that is blocked will need to be protected until the numbness is gone and the  Strength has returned. Because you cannot feel it, you will need to take extra care to avoid injury. Because it may be weak, you may have difficulty moving it or using it. You may not know what position it is in without looking at it while the block is in effect. ° °3. For blocks in the legs and feet, returning to weight bearing and walking needs to be done carefully. You will need to wait until the numbness is entirely gone and the strength has returned. You should be able to move your leg and foot normally before you try and bear weight or walk. You will need someone to be with you when you first try to ensure you do not fall and possibly risk injury. ° °4. Bruising and tenderness at the needle site are common side effects and will resolve in a few days. ° °5. Persistent numbness or new problems with movement should be communicated to the surgeon or the [facility] ([phone])/ [facility] ([phone]). °

## 2015-12-09 NOTE — Interval H&P Note (Signed)
History and Physical Interval Note:  12/09/2015 7:39 AM  Penny Myers  has presented today for surgery, with the diagnosis of other articular cartilage disorders, left shoulder, Primary osteoarthritis, left shoulder, Impingement syndrome of left shoulder, Strain of muscle(s) and tendon(s) of the rotator cuff of  The various methods of treatment have been discussed with the patient and family. After consideration of risks, benefits and other options for treatment, the patient has consented to  Procedure(s): LEFT SHOULDER ARTHROSCOPY WITH DEBRIDEMENT, DISTAL CLAVICAL EXCISION, ACROMIOPLASTY, ROTATOR CUFF REPAIR AND BANKART REPAIR (Left) as a surgical intervention .  The patient's history has been reviewed, patient examined, no change in status, stable for surgery.  I have reviewed the patient's chart and labs.  Questions were answered to the patient's satisfaction.     Loreta Aveaniel F Citlaly Camplin

## 2015-12-09 NOTE — Anesthesia Postprocedure Evaluation (Signed)
Anesthesia Post Note  Patient: Penny Myers  Procedure(s) Performed: Procedure(s) (LRB): SHOULDER ARTHROSCOPY WITH SUBACROMIAL DECOMPRESSION (Left)  Patient location during evaluation: PACU Anesthesia Type: General Level of consciousness: awake and alert Pain management: pain level controlled Vital Signs Assessment: post-procedure vital signs reviewed and stable Respiratory status: spontaneous breathing, nonlabored ventilation, respiratory function stable and patient connected to nasal cannula oxygen Cardiovascular status: blood pressure returned to baseline and stable Postop Assessment: no signs of nausea or vomiting Anesthetic complications: no    Last Vitals:  Filed Vitals:   12/09/15 1100 12/09/15 1130  BP: 121/81 135/83  Pulse: 82 73  Temp:  36.6 C  Resp: 12 18    Last Pain:  Filed Vitals:   12/09/15 1147  PainSc: 7                  Penny Myers DAVID

## 2015-12-10 ENCOUNTER — Encounter (HOSPITAL_BASED_OUTPATIENT_CLINIC_OR_DEPARTMENT_OTHER): Payer: Self-pay | Admitting: Orthopedic Surgery

## 2015-12-10 NOTE — Op Note (Signed)
NAMVinnie Level:  Myers, Penny Myers                ACCOUNT NO.:  [id number]  MEDICAL RECORD NO.:  [id number]  LOCATION:                                 FACILITY:  PHYSICIAN:  Loreta Aveaniel F. Kahlen Morais, M.D. DATE OF BIRTH:  07-Oct-1970  DATE OF PROCEDURE:  12/09/2015 DATE OF DISCHARGE:                              OPERATIVE REPORT   PREOPERATIVE DIAGNOSES:  Longstanding impingement, left shoulder. Partial tearing rotator cuff.  Small os acromiale.  Degenerative joint disease of acromioclavicular joint.  POSTOPERATIVE DIAGNOSES:  Longstanding impingement, left shoulder. Partial tearing rotator cuff.  Small os acromiale.  Degenerative joint disease of acromioclavicular joint with some partial tearing rotator cuff, supraspinatus above and below, nothing full-thickness.  Anterior labral tear.  PROCEDURES:  Left shoulder exam under anesthesia, arthroscopy. Debridement of labrum and rotator cuff.  Bursectomy, acromioplasty, excision of small anterior os acromion, release of coracoacromial ligament.  Excision of distal clavicle.  SURGEON:  Loreta Aveaniel F. Veronia Laprise, M.D.  ASSISTANT:  Mikey KirschnerLindsey Stanberry, PA, present throughout the entire case and necessary for timely completion of procedure.  ANESTHESIA:  General.  BLOOD LOSS:  Minimal.  SPECIMENS:  None.  CULTURES:  None.  COMPLICATIONS:  None.  DRESSINGS:  Soft compressive with sling.  DESCRIPTION OF PROCEDURE:  The patient was brought to the operating room and placed on the operating table in supine position.  After adequate anesthesia had been obtained, shoulder examined.  Full motion with stable shoulder.  Placed on beach-chair position.  Shoulder positioner, prepped and draped in usual sterile fashion.  Three portals; anterior, posterior and lateral.  Arthroscope was introduced, shoulder was distended and inspected.  Some complex tearing in front of the labrum debrided.  Biceps tendon, biceps anchor, articular cartilage, capsuloligamentous structures  intact.  Flap tears off the bottom of the supraspinatus tendon along the cable debrided.  Question intact, nothing full-thickness.  Cannula redirected subacromially.  Type 3 acromion with an additional small mobile os of the front.  Some abrasive changes on the top of the cuff.  No full-thickness tears.  Bursa resected.  This small os excised with a bur.  CA ligament released.  Acromioplasty to a type 1 acromion.  Grade IV changes, spurring AC joint.  Periarticular spurs of lateral centimeter of clavicle resected.  Adequacy of decompression confirmed and viewing from all portals.  Instruments and fluid were removed.  Portals were closed with nylon.  Sterile compressive dressing applied.  Anesthesia reversed.  Brought to the recovery room.  Tolerated the surgery well.  No complications.     Loreta Aveaniel F. Kregg Cihlar, M.D.     DFM/MEDQ  D:  12/09/2015  T:  12/09/2015  Job:  [id number]

## 2015-12-24 ENCOUNTER — Encounter (HOSPITAL_BASED_OUTPATIENT_CLINIC_OR_DEPARTMENT_OTHER): Payer: Self-pay | Admitting: Orthopedic Surgery

## 2016-07-10 ENCOUNTER — Ambulatory Visit: Payer: Self-pay | Attending: Oncology | Admitting: *Deleted

## 2016-07-10 ENCOUNTER — Encounter: Payer: Self-pay | Admitting: *Deleted

## 2016-07-10 ENCOUNTER — Ambulatory Visit
Admission: RE | Admit: 2016-07-10 | Discharge: 2016-07-10 | Disposition: A | Discharge status: Home / Self Care | Payer: Self-pay | Source: Ambulatory Visit | Attending: Oncology | Admitting: Oncology

## 2016-07-10 VITALS — BP 149/98 | HR 72 | Temp 98.3°F | Ht 64.96 in | Wt 214.6 lb

## 2016-07-10 DIAGNOSIS — Z0000 Encounter for general adult medical examination without abnormal findings: Secondary | ICD-10-CM

## 2016-07-10 NOTE — Patient Instructions (Signed)
Hypertension Hypertension, commonly called high blood pressure, is when the force of blood pumping through your arteries is too strong. Your arteries are the blood vessels that carry blood from your heart throughout your body. A blood pressure reading consists of a higher number over a lower number, such as 110/72. The higher number (systolic) is the pressure inside your arteries when your heart pumps. The lower number (diastolic) is the pressure inside your arteries when your heart relaxes. Ideally you want your blood pressure below 120/80. Hypertension forces your heart to work harder to pump blood. Your arteries may become narrow or stiff. Having untreated or uncontrolled hypertension can cause heart attack, stroke, kidney disease, and other problems. RISK FACTORS Some risk factors for high blood pressure are controllable. Others are not.  Risk factors you cannot control include:   Race. You may be at higher risk if you are African American.  Age. Risk increases with age.  Gender. Men are at higher risk than women before age 45 years. After age 65, women are at higher risk than men. Risk factors you can control include:  Not getting enough exercise or physical activity.  Being overweight.  Getting too much fat, sugar, calories, or salt in your diet.  Drinking too much alcohol. SIGNS AND SYMPTOMS Hypertension does not usually cause signs or symptoms. Extremely high blood pressure (hypertensive crisis) may cause headache, anxiety, shortness of breath, and nosebleed. DIAGNOSIS To check if you have hypertension, your health care provider will measure your blood pressure while you are seated, with your arm held at the level of your heart. It should be measured at least twice using the same arm. Certain conditions can cause a difference in blood pressure between your right and left arms. A blood pressure reading that is higher than normal on one occasion does not mean that you need treatment. If  it is not clear whether you have high blood pressure, you may be asked to return on a different day to have your blood pressure checked again. Or, you may be asked to monitor your blood pressure at home for 1 or more weeks. TREATMENT Treating high blood pressure includes making lifestyle changes and possibly taking medicine. Living a healthy lifestyle can help lower high blood pressure. You may need to change some of your habits. Lifestyle changes may include:  Following the DASH diet. This diet is high in fruits, vegetables, and whole grains. It is low in salt, red meat, and added sugars.  Keep your sodium intake below 2,300 mg per day.  Getting at least 30-45 minutes of aerobic exercise at least 4 times per week.  Losing weight if necessary.  Not smoking.  Limiting alcoholic beverages.  Learning ways to reduce stress. Your health care provider may prescribe medicine if lifestyle changes are not enough to get your blood pressure under control, and if one of the following is true:  You are 18-59 years of age and your systolic blood pressure is above 140.  You are 60 years of age or older, and your systolic blood pressure is above 150.  Your diastolic blood pressure is above 90.  You have diabetes, and your systolic blood pressure is over 140 or your diastolic blood pressure is over 90.  You have kidney disease and your blood pressure is above 140/90.  You have heart disease and your blood pressure is above 140/90. Your personal target blood pressure may vary depending on your medical conditions, your age, and other factors. HOME CARE INSTRUCTIONS    Have your blood pressure rechecked as directed by your health care provider.   Take medicines only as directed by your health care provider. Follow the directions carefully. Blood pressure medicines must be taken as prescribed. The medicine does not work as well when you skip doses. Skipping doses also puts you at risk for  problems.  Do not smoke.   Monitor your blood pressure at home as directed by your health care provider. SEEK MEDICAL CARE IF:   You think you are having a reaction to medicines taken.  You have recurrent headaches or feel dizzy.  You have swelling in your ankles.  You have trouble with your vision. SEEK IMMEDIATE MEDICAL CARE IF:  You develop a severe headache or confusion.  You have unusual weakness, numbness, or feel faint.  You have severe chest or abdominal pain.  You vomit repeatedly.  You have trouble breathing. MAKE SURE YOU:   Understand these instructions.  Will watch your condition.  Will get help right away if you are not doing well or get worse.   This information is not intended to replace advice given to you by your health care provider. Make sure you discuss any questions you have with your health care provider.   Document Released: 09/04/2005 Document Revised: 01/19/2015 Document Reviewed: 06/27/2013 Elsevier Interactive Patient Education 2016 Elsevier Inc.   Gave patient hand-out, Women Staying Healthy, Active and Well from [facility], with education on breast health, pap smears, heart and colon health.  

## 2016-07-10 NOTE — Progress Notes (Signed)
Subjective:     Patient ID: Penny Myers, female   DOB: Oct 26, 1970, 45 y.o.   MRN: [id number]  HPI   Review of Systems     Objective:   Physical Exam  Neck: Thyromegaly present.  Pulmonary/Chest: Right breast exhibits no inverted nipple, no mass, no nipple discharge, no skin change and no tenderness. Left breast exhibits no inverted nipple, no mass, no nipple discharge, no skin change and no tenderness. Breasts are symmetrical.       Assessment:     45 year old Black [facility].  Clinical breast exam unremarkable.  Taught self breast awareness.  On exam the thyroid is enlarged.  Patient states her PCP is doing test on her enlarged thyroid. Blood pressure elevated at 149/98.  She is to recheck her blood pressure at [facility] or [facility], and if remains higher than 140/90 she is to follow-up with her primary care provider.  Hand out on hypertention given to patient. Patient has been screened for eligibility.  She does not have any insurance, Medicare or Medicaid.  She also meets financial eligibility.  Hand-out given on the Affordable Care Act.    Plan:     Screening mammogram ordered.  Will follow-up per [facility] protocol.

## 2016-07-11 ENCOUNTER — Encounter: Payer: Self-pay | Admitting: *Deleted

## 2016-07-11 NOTE — Progress Notes (Signed)
Letter mailed from the [facility] to inform patient of her normal mammogram results.  Patient is to follow-up with annual screening in one year.  HSIS to Christy. 

## 2016-10-30 ENCOUNTER — Other Ambulatory Visit: Payer: Self-pay | Admitting: Internal Medicine

## 2016-10-30 DIAGNOSIS — E049 Nontoxic goiter, unspecified: Secondary | ICD-10-CM

## 2016-10-30 DIAGNOSIS — R918 Other nonspecific abnormal finding of lung field: Secondary | ICD-10-CM

## 2016-11-10 ENCOUNTER — Ambulatory Visit
Admission: RE | Admit: 2016-11-10 | Discharge: 2016-11-10 | Disposition: A | Discharge status: Home / Self Care | Payer: Self-pay | Source: Ambulatory Visit | Attending: Internal Medicine | Admitting: Internal Medicine

## 2016-11-10 DIAGNOSIS — R911 Solitary pulmonary nodule: Secondary | ICD-10-CM | POA: Insufficient documentation

## 2016-11-10 DIAGNOSIS — E049 Nontoxic goiter, unspecified: Secondary | ICD-10-CM | POA: Insufficient documentation

## 2016-11-10 DIAGNOSIS — R918 Other nonspecific abnormal finding of lung field: Secondary | ICD-10-CM

## 2017-07-12 ENCOUNTER — Other Ambulatory Visit: Payer: Self-pay | Admitting: Internal Medicine

## 2017-07-12 DIAGNOSIS — Z1231 Encounter for screening mammogram for malignant neoplasm of breast: Secondary | ICD-10-CM

## 2017-08-03 ENCOUNTER — Ambulatory Visit
Admission: RE | Admit: 2017-08-03 | Discharge: 2017-08-03 | Disposition: A | Discharge status: Home / Self Care | Payer: BLUE CROSS/BLUE SHIELD | Source: Ambulatory Visit | Attending: Internal Medicine | Admitting: Internal Medicine

## 2017-08-03 DIAGNOSIS — Z1231 Encounter for screening mammogram for malignant neoplasm of breast: Secondary | ICD-10-CM | POA: Insufficient documentation

## 2017-12-01 ENCOUNTER — Encounter (HOSPITAL_COMMUNITY): Payer: Self-pay | Admitting: Emergency Medicine

## 2017-12-01 ENCOUNTER — Emergency Department (HOSPITAL_COMMUNITY): Payer: No Typology Code available for payment source

## 2017-12-01 ENCOUNTER — Emergency Department (HOSPITAL_COMMUNITY)
Admission: EM | Admit: 2017-12-01 | Discharge: 2017-12-01 | Disposition: A | Discharge status: Home / Self Care | Payer: No Typology Code available for payment source | Attending: Emergency Medicine | Admitting: Emergency Medicine

## 2017-12-01 ENCOUNTER — Other Ambulatory Visit: Payer: Self-pay

## 2017-12-01 DIAGNOSIS — Z79899 Other long term (current) drug therapy: Secondary | ICD-10-CM | POA: Insufficient documentation

## 2017-12-01 DIAGNOSIS — I10 Essential (primary) hypertension: Secondary | ICD-10-CM | POA: Insufficient documentation

## 2017-12-01 DIAGNOSIS — Y999 Unspecified external cause status: Secondary | ICD-10-CM | POA: Insufficient documentation

## 2017-12-01 DIAGNOSIS — S39012A Strain of muscle, fascia and tendon of lower back, initial encounter: Secondary | ICD-10-CM | POA: Diagnosis not present

## 2017-12-01 DIAGNOSIS — Y92410 Unspecified street and highway as the place of occurrence of the external cause: Secondary | ICD-10-CM | POA: Diagnosis not present

## 2017-12-01 DIAGNOSIS — Y9389 Activity, other specified: Secondary | ICD-10-CM | POA: Diagnosis not present

## 2017-12-01 DIAGNOSIS — R51 Headache: Secondary | ICD-10-CM | POA: Insufficient documentation

## 2017-12-01 DIAGNOSIS — F17210 Nicotine dependence, cigarettes, uncomplicated: Secondary | ICD-10-CM | POA: Insufficient documentation

## 2017-12-01 DIAGNOSIS — S3992XA Unspecified injury of lower back, initial encounter: Secondary | ICD-10-CM | POA: Diagnosis present

## 2017-12-01 MED ORDER — ONDANSETRON 4 MG PO TBDP
4.0000 mg | ORAL_TABLET | Freq: Once | ORAL | Status: AC
  Administered 2017-12-01: 4 mg via ORAL
  Filled 2017-12-01: qty 1

## 2017-12-01 MED ORDER — CYCLOBENZAPRINE HCL 10 MG PO TABS
10.0000 mg | ORAL_TABLET | Freq: Once | ORAL | Status: AC
  Administered 2017-12-01: 10 mg via ORAL
  Filled 2017-12-01: qty 1

## 2017-12-01 MED ORDER — CYCLOBENZAPRINE HCL 5 MG PO TABS: 5.0000 mg | ORAL_TABLET | Freq: Three times a day (TID) | ORAL | 0 refills | Status: DC | PRN

## 2017-12-01 MED ORDER — IBUPROFEN 600 MG PO TABS: 600.0000 mg | ORAL_TABLET | Freq: Four times a day (QID) | ORAL | 0 refills | Status: DC | PRN

## 2017-12-01 MED ORDER — KETOROLAC TROMETHAMINE 30 MG/ML IJ SOLN
30.0000 mg | Freq: Once | INTRAMUSCULAR | Status: AC
  Administered 2017-12-01: 30 mg via INTRAMUSCULAR
  Filled 2017-12-01: qty 1

## 2017-12-01 NOTE — ED Notes (Signed)
C/o headache 8/10 and lower back/ left groin pain 7/10.
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
## 2018-11-22 ENCOUNTER — Other Ambulatory Visit: Payer: Self-pay | Admitting: Internal Medicine

## 2018-11-22 DIAGNOSIS — Z1231 Encounter for screening mammogram for malignant neoplasm of breast: Secondary | ICD-10-CM

## 2018-11-29 ENCOUNTER — Ambulatory Visit
Admission: RE | Admit: 2018-11-29 | Discharge: 2018-11-29 | Disposition: A | Discharge status: Home / Self Care | Payer: 59 | Source: Ambulatory Visit | Attending: Internal Medicine | Admitting: Internal Medicine

## 2018-11-29 ENCOUNTER — Other Ambulatory Visit: Payer: Self-pay

## 2018-11-29 DIAGNOSIS — Z1231 Encounter for screening mammogram for malignant neoplasm of breast: Secondary | ICD-10-CM | POA: Diagnosis not present

## 2019-11-21 ENCOUNTER — Other Ambulatory Visit: Payer: Self-pay

## 2019-11-21 DIAGNOSIS — S161XXS Strain of muscle, fascia and tendon at neck level, sequela: Secondary | ICD-10-CM | POA: Insufficient documentation

## 2019-11-21 DIAGNOSIS — R0789 Other chest pain: Secondary | ICD-10-CM | POA: Insufficient documentation

## 2019-11-21 DIAGNOSIS — R519 Headache, unspecified: Secondary | ICD-10-CM | POA: Insufficient documentation

## 2019-11-21 DIAGNOSIS — X58XXXS Exposure to other specified factors, sequela: Secondary | ICD-10-CM | POA: Insufficient documentation

## 2019-11-21 DIAGNOSIS — I10 Essential (primary) hypertension: Secondary | ICD-10-CM | POA: Insufficient documentation

## 2019-11-21 DIAGNOSIS — Z79899 Other long term (current) drug therapy: Secondary | ICD-10-CM | POA: Insufficient documentation

## 2019-11-21 DIAGNOSIS — F17210 Nicotine dependence, cigarettes, uncomplicated: Secondary | ICD-10-CM | POA: Insufficient documentation

## 2019-11-22 ENCOUNTER — Other Ambulatory Visit: Payer: Self-pay

## 2019-11-22 ENCOUNTER — Encounter (HOSPITAL_COMMUNITY): Payer: Self-pay

## 2019-11-22 ENCOUNTER — Emergency Department (HOSPITAL_COMMUNITY)
Admission: EM | Admit: 2019-11-22 | Discharge: 2019-11-22 | Disposition: A | Discharge status: Home / Self Care | Payer: 59 | Attending: Emergency Medicine | Admitting: Emergency Medicine

## 2019-11-22 DIAGNOSIS — R0789 Other chest pain: Secondary | ICD-10-CM

## 2019-11-22 DIAGNOSIS — S46812S Strain of other muscles, fascia and tendons at shoulder and upper arm level, left arm, sequela: Secondary | ICD-10-CM

## 2019-11-22 LAB — CBC WITH DIFFERENTIAL/PLATELET
Abs Immature Granulocytes: 0.04 10*3/uL (ref 0.00–0.07)
Basophils Absolute: 0 10*3/uL (ref 0.0–0.1)
Basophils Relative: 0 %
Eosinophils Absolute: 0 10*3/uL (ref 0.0–0.5)
Eosinophils Relative: 0 %
HCT: 47 % — ABNORMAL HIGH (ref 36.0–46.0)
Hemoglobin: 14.8 g/dL (ref 12.0–15.0)
Immature Granulocytes: 0 %
Lymphocytes Relative: 11 %
Lymphs Abs: 1.7 10*3/uL (ref 0.7–4.0)
MCH: 28.3 pg (ref 26.0–34.0)
MCHC: 31.5 g/dL (ref 30.0–36.0)
MCV: 89.9 fL (ref 80.0–100.0)
Monocytes Absolute: 0.5 10*3/uL (ref 0.1–1.0)
Monocytes Relative: 3 %
Neutro Abs: 12.9 10*3/uL — ABNORMAL HIGH (ref 1.7–7.7)
Neutrophils Relative %: 86 %
Platelets: 248 10*3/uL (ref 150–400)
RBC: 5.23 MIL/uL — ABNORMAL HIGH (ref 3.87–5.11)
RDW: 13.8 % (ref 11.5–15.5)
WBC: 15.1 10*3/uL — ABNORMAL HIGH (ref 4.0–10.5)
nRBC: 0 % (ref 0.0–0.2)

## 2019-11-22 LAB — COMPREHENSIVE METABOLIC PANEL
ALT: 20 U/L (ref 0–44)
AST: 18 U/L (ref 15–41)
Albumin: 4.4 g/dL (ref 3.5–5.0)
Alkaline Phosphatase: 71 U/L (ref 38–126)
Anion gap: 11 (ref 5–15)
BUN: 14 mg/dL (ref 6–20)
CO2: 25 mmol/L (ref 22–32)
Calcium: 9.4 mg/dL (ref 8.9–10.3)
Chloride: 102 mmol/L (ref 98–111)
Creatinine, Ser: 1.06 mg/dL — ABNORMAL HIGH (ref 0.44–1.00)
GFR calc Af Amer: >60 mL/min (ref >60)
GFR calc non Af Amer: >60 mL/min (ref >60)
Glucose, Bld: 142 mg/dL — ABNORMAL HIGH (ref 70–99)
Potassium: 3.5 mmol/L (ref 3.5–5.1)
Sodium: 138 mmol/L (ref 135–145)
Total Bilirubin: 0.6 mg/dL (ref 0.3–1.2)
Total Protein: 7.7 g/dL (ref 6.5–8.1)

## 2019-11-22 LAB — TROPONIN I (HIGH SENSITIVITY)
Troponin I (High Sensitivity): 5 ng/L (ref <18)
Troponin I (High Sensitivity): 5 ng/L (ref <18)

## 2019-11-22 MED ORDER — METHOCARBAMOL 500 MG PO TABS: ORAL_TABLET | ORAL | 0 refills | Status: DC

## 2019-11-22 MED ORDER — METHOCARBAMOL 500 MG PO TABS
1000.0000 mg | ORAL_TABLET | Freq: Once | ORAL | Status: AC
  Administered 2019-11-22: 1000 mg via ORAL
  Filled 2019-11-22: qty 2

## 2019-11-22 MED ORDER — NAPROXEN 500 MG PO TABS: 500.0000 mg | ORAL_TABLET | Freq: Two times a day (BID) | ORAL | 0 refills | Status: DC

## 2019-11-22 MED ORDER — KETOROLAC TROMETHAMINE 30 MG/ML IJ SOLN
30.0000 mg | Freq: Once | INTRAMUSCULAR | Status: AC
  Administered 2019-11-22: 30 mg via INTRAMUSCULAR
  Filled 2019-11-22: qty 1
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
## 2019-11-22 NOTE — ED Triage Notes (Signed)
Pt reports approx 1 hour ago she had a sudden onset of feeling lightheaded, dizzy, flushed feeling in her chest, pain in the back of her head, multiple complaints.  Pt states the only thing that is different to day is that she got her hair dyed, but denies allergic type symptoms.

## 2019-11-22 NOTE — Discharge Instructions (Addendum)
Use ice and heat on the sore muscle for comfort.  Take the medication as prescribed.  You are very tender over your left trapezius muscle, these medication should get that better over the next several days.  Recheck if you get a fever, struggle to breathe, or you seem worse.

## 2019-12-26 ENCOUNTER — Ambulatory Visit: Payer: Self-pay

## 2019-12-29 ENCOUNTER — Ambulatory Visit: Payer: Self-pay

## 2020-01-05 ENCOUNTER — Other Ambulatory Visit: Payer: Self-pay | Admitting: Internal Medicine

## 2020-01-05 DIAGNOSIS — Z1231 Encounter for screening mammogram for malignant neoplasm of breast: Secondary | ICD-10-CM

## 2020-01-16 ENCOUNTER — Ambulatory Visit
Admission: RE | Admit: 2020-01-16 | Discharge: 2020-01-16 | Disposition: A | Discharge status: Home / Self Care | Payer: 59 | Source: Ambulatory Visit | Attending: Internal Medicine | Admitting: Internal Medicine

## 2020-01-16 DIAGNOSIS — Z1231 Encounter for screening mammogram for malignant neoplasm of breast: Secondary | ICD-10-CM

## 2020-01-19 ENCOUNTER — Other Ambulatory Visit: Payer: Self-pay | Admitting: Internal Medicine

## 2020-01-19 DIAGNOSIS — R928 Other abnormal and inconclusive findings on diagnostic imaging of breast: Secondary | ICD-10-CM

## 2020-01-26 ENCOUNTER — Other Ambulatory Visit: Payer: Self-pay | Admitting: Internal Medicine

## 2020-01-26 DIAGNOSIS — N6320 Unspecified lump in the left breast, unspecified quadrant: Secondary | ICD-10-CM

## 2020-01-26 DIAGNOSIS — R928 Other abnormal and inconclusive findings on diagnostic imaging of breast: Secondary | ICD-10-CM

## 2020-01-29 ENCOUNTER — Ambulatory Visit
Admission: RE | Admit: 2020-01-29 | Discharge: 2020-01-29 | Disposition: A | Discharge status: Home / Self Care | Payer: No Typology Code available for payment source | Source: Ambulatory Visit | Attending: Internal Medicine | Admitting: Internal Medicine

## 2020-01-29 DIAGNOSIS — R928 Other abnormal and inconclusive findings on diagnostic imaging of breast: Secondary | ICD-10-CM | POA: Insufficient documentation

## 2020-01-29 DIAGNOSIS — N6320 Unspecified lump in the left breast, unspecified quadrant: Secondary | ICD-10-CM | POA: Insufficient documentation

## 2021-03-18 ENCOUNTER — Other Ambulatory Visit: Payer: Self-pay | Admitting: Internal Medicine

## 2021-03-18 DIAGNOSIS — Z1231 Encounter for screening mammogram for malignant neoplasm of breast: Secondary | ICD-10-CM

## 2021-04-22 ENCOUNTER — Ambulatory Visit
Admission: RE | Admit: 2021-04-22 | Discharge: 2021-04-22 | Disposition: A | Discharge status: Home / Self Care | Payer: 59 | Source: Ambulatory Visit | Attending: Internal Medicine | Admitting: Internal Medicine

## 2021-04-22 ENCOUNTER — Other Ambulatory Visit: Payer: Self-pay

## 2021-04-22 DIAGNOSIS — Z1231 Encounter for screening mammogram for malignant neoplasm of breast: Secondary | ICD-10-CM

## 2021-06-30 ENCOUNTER — Ambulatory Visit
Admission: RE | Admit: 2021-06-30 | Discharge: 2021-06-30 | Disposition: A | Discharge status: Home / Self Care | Payer: 59 | Source: Ambulatory Visit | Attending: Internal Medicine | Admitting: Internal Medicine

## 2021-06-30 ENCOUNTER — Other Ambulatory Visit: Payer: Self-pay | Admitting: Internal Medicine

## 2021-06-30 ENCOUNTER — Ambulatory Visit
Admission: RE | Admit: 2021-06-30 | Discharge: 2021-06-30 | Disposition: A | Discharge status: Home / Self Care | Payer: 59 | Attending: Internal Medicine | Admitting: Internal Medicine

## 2021-06-30 ENCOUNTER — Other Ambulatory Visit: Payer: Self-pay

## 2021-06-30 DIAGNOSIS — R079 Chest pain, unspecified: Secondary | ICD-10-CM

## 2021-08-16 ENCOUNTER — Ambulatory Visit
Admission: RE | Admit: 2021-08-16 | Discharge: 2021-08-16 | Disposition: A | Discharge status: Home / Self Care | Payer: 59 | Source: Ambulatory Visit | Attending: Internal Medicine | Admitting: Internal Medicine

## 2021-08-16 ENCOUNTER — Ambulatory Visit
Admission: RE | Admit: 2021-08-16 | Discharge: 2021-08-16 | Disposition: A | Discharge status: Home / Self Care | Payer: 59 | Attending: Internal Medicine | Admitting: Internal Medicine

## 2021-08-16 ENCOUNTER — Other Ambulatory Visit: Payer: Self-pay | Admitting: Internal Medicine

## 2021-08-16 DIAGNOSIS — M542 Cervicalgia: Secondary | ICD-10-CM | POA: Diagnosis not present

## 2021-08-17 ENCOUNTER — Other Ambulatory Visit: Payer: Self-pay | Admitting: Internal Medicine

## 2021-08-17 DIAGNOSIS — R42 Dizziness and giddiness: Secondary | ICD-10-CM

## 2021-09-06 ENCOUNTER — Ambulatory Visit (HOSPITAL_BASED_OUTPATIENT_CLINIC_OR_DEPARTMENT_OTHER): Admission: RE | Admit: 2021-09-06 | Payer: 59 | Source: Ambulatory Visit

## 2021-09-06 ENCOUNTER — Ambulatory Visit (HOSPITAL_BASED_OUTPATIENT_CLINIC_OR_DEPARTMENT_OTHER): Payer: 59

## 2021-09-09 ENCOUNTER — Ambulatory Visit (HOSPITAL_COMMUNITY): Payer: 59

## 2021-09-13 ENCOUNTER — Ambulatory Visit (HOSPITAL_COMMUNITY)
Admission: RE | Admit: 2021-09-13 | Discharge: 2021-09-13 | Disposition: A | Discharge status: Home / Self Care | Payer: 59 | Source: Ambulatory Visit | Attending: Internal Medicine | Admitting: Internal Medicine

## 2021-09-13 DIAGNOSIS — R42 Dizziness and giddiness: Secondary | ICD-10-CM

## 2021-09-13 LAB — POCT I-STAT CREATININE: Creatinine, Ser: 1.1 mg/dL — ABNORMAL HIGH (ref 0.44–1.00)

## 2021-09-13 MED ORDER — IOHEXOL 350 MG/ML SOLN
80.0000 mL | Freq: Once | INTRAVENOUS | Status: AC | PRN
  Administered 2021-09-13: 17:00:00 80 mL via INTRAVENOUS

## 2021-10-03 DIAGNOSIS — R69 Illness, unspecified: Secondary | ICD-10-CM | POA: Diagnosis not present

## 2021-10-03 DIAGNOSIS — J309 Allergic rhinitis, unspecified: Secondary | ICD-10-CM | POA: Diagnosis not present

## 2021-10-03 DIAGNOSIS — R7302 Impaired glucose tolerance (oral): Secondary | ICD-10-CM | POA: Diagnosis not present

## 2021-10-03 DIAGNOSIS — E611 Iron deficiency: Secondary | ICD-10-CM | POA: Diagnosis not present

## 2021-10-03 DIAGNOSIS — K219 Gastro-esophageal reflux disease without esophagitis: Secondary | ICD-10-CM | POA: Diagnosis not present

## 2021-10-03 DIAGNOSIS — G441 Vascular headache, not elsewhere classified: Secondary | ICD-10-CM | POA: Diagnosis not present

## 2021-10-03 DIAGNOSIS — E782 Mixed hyperlipidemia: Secondary | ICD-10-CM | POA: Diagnosis not present

## 2021-10-03 DIAGNOSIS — I10 Essential (primary) hypertension: Secondary | ICD-10-CM | POA: Diagnosis not present

## 2021-10-17 DIAGNOSIS — H903 Sensorineural hearing loss, bilateral: Secondary | ICD-10-CM | POA: Diagnosis not present

## 2021-10-17 DIAGNOSIS — R69 Illness, unspecified: Secondary | ICD-10-CM | POA: Diagnosis not present

## 2021-10-17 DIAGNOSIS — H6983 Other specified disorders of Eustachian tube, bilateral: Secondary | ICD-10-CM | POA: Diagnosis not present

## 2021-10-17 DIAGNOSIS — H9312 Tinnitus, left ear: Secondary | ICD-10-CM | POA: Diagnosis not present

## 2021-10-17 DIAGNOSIS — J301 Allergic rhinitis due to pollen: Secondary | ICD-10-CM | POA: Diagnosis not present

## 2021-10-18 ENCOUNTER — Other Ambulatory Visit: Payer: Self-pay

## 2021-10-18 ENCOUNTER — Ambulatory Visit
Admission: RE | Admit: 2021-10-18 | Discharge: 2021-10-18 | Disposition: A | Discharge status: Home / Self Care | Payer: 59 | Attending: Family | Admitting: Family

## 2021-10-18 ENCOUNTER — Ambulatory Visit
Admission: RE | Admit: 2021-10-18 | Discharge: 2021-10-18 | Disposition: A | Discharge status: Home / Self Care | Payer: 59 | Source: Ambulatory Visit | Attending: Family | Admitting: Family

## 2021-10-18 ENCOUNTER — Other Ambulatory Visit: Payer: Self-pay | Admitting: Family

## 2021-10-18 DIAGNOSIS — R0602 Shortness of breath: Secondary | ICD-10-CM

## 2021-10-18 DIAGNOSIS — J019 Acute sinusitis, unspecified: Secondary | ICD-10-CM | POA: Diagnosis not present

## 2021-10-18 DIAGNOSIS — J329 Chronic sinusitis, unspecified: Secondary | ICD-10-CM | POA: Diagnosis not present

## 2021-10-18 DIAGNOSIS — K219 Gastro-esophageal reflux disease without esophagitis: Secondary | ICD-10-CM | POA: Diagnosis not present

## 2021-10-18 DIAGNOSIS — I10 Essential (primary) hypertension: Secondary | ICD-10-CM | POA: Diagnosis not present

## 2021-11-10 DIAGNOSIS — J343 Hypertrophy of nasal turbinates: Secondary | ICD-10-CM | POA: Insufficient documentation

## 2021-11-10 DIAGNOSIS — H9312 Tinnitus, left ear: Secondary | ICD-10-CM | POA: Insufficient documentation

## 2021-11-10 DIAGNOSIS — J328 Other chronic sinusitis: Secondary | ICD-10-CM | POA: Diagnosis not present

## 2021-11-10 DIAGNOSIS — R519 Headache, unspecified: Secondary | ICD-10-CM | POA: Diagnosis not present

## 2021-11-16 DIAGNOSIS — M26609 Unspecified temporomandibular joint disorder, unspecified side: Secondary | ICD-10-CM | POA: Diagnosis not present

## 2021-11-16 DIAGNOSIS — G43709 Chronic migraine without aura, not intractable, without status migrainosus: Secondary | ICD-10-CM | POA: Diagnosis not present

## 2021-12-02 DIAGNOSIS — G43709 Chronic migraine without aura, not intractable, without status migrainosus: Secondary | ICD-10-CM | POA: Diagnosis not present

## 2021-12-08 ENCOUNTER — Other Ambulatory Visit: Payer: Self-pay

## 2021-12-08 ENCOUNTER — Ambulatory Visit (LOCAL_COMMUNITY_HEALTH_CENTER): Payer: 59

## 2021-12-08 DIAGNOSIS — H903 Sensorineural hearing loss, bilateral: Secondary | ICD-10-CM | POA: Insufficient documentation

## 2021-12-08 DIAGNOSIS — Z23 Encounter for immunization: Secondary | ICD-10-CM

## 2021-12-08 DIAGNOSIS — R519 Headache, unspecified: Secondary | ICD-10-CM | POA: Diagnosis not present

## 2021-12-08 DIAGNOSIS — Z719 Counseling, unspecified: Secondary | ICD-10-CM

## 2021-12-08 DIAGNOSIS — H9312 Tinnitus, left ear: Secondary | ICD-10-CM | POA: Diagnosis not present

## 2021-12-08 NOTE — Progress Notes (Signed)
?  Are you feeling sick today? No ? ? ?Have you ever received a dose of COVID-19 Vaccine? [employer], [place], [place], [employer], Other) Yes ? ?If yes, which vaccine and how many doses?   Pfizer primary 2 doses ? ? ?Did you bring the vaccination record card or other documentation?  No ? ? ?Do you have a health condition or are undergoing treatment that makes you moderately or severely immunocompromised? This would include, but not be limited to: cancer, HIV, organ transplant, immunosuppressive therapy/high-dose corticosteroids, or moderate/severe primary immunodeficiency.  No ? ?Have you received COVID-19 vaccine before or during hematopoietic cell transplant (HCT) or CAR-T-cell therapies? No ? ?Have you ever had an allergic reaction to: (This would include a severe allergic reaction or a reaction that caused hives, swelling, or respiratory distress, including wheezing.) A component of a COVID-19 vaccine or a previous dose of COVID-19 vaccine? No ? ? ?Have you ever had an allergic reaction to another vaccine (other thanCOVID-19 vaccine) or an injectable medication? (This would include a severe allergic reaction or a reaction that caused hives, swelling, or respiratory distress, including wheezing.)   No ?  ?Do you have a history of any of the following: ? ?Myocarditis or Pericarditis No ?Thrombosis with thrombocytopenia syndrome (TTS) No ?Multisystem Inflammatory Syndrome (MIS-C or MIS-A)? No ?Immune-mediate syndrome defined by thrombosis and thrombocytopenia, such as heparin--induced thrombocytopenia (HIT)  No ?Guillain-Barr? Syndrome (GBS) No ?COVID-19 disease within the past 3 months? No ?Vaccinated with monkeypox vaccine in the last 4 weeks? No ? ?[facility] and COVID card updated and given to patient. Ranae Palms, RN ? ?

## 2021-12-15 DIAGNOSIS — H6503 Acute serous otitis media, bilateral: Secondary | ICD-10-CM | POA: Diagnosis not present

## 2022-01-12 DIAGNOSIS — E782 Mixed hyperlipidemia: Secondary | ICD-10-CM | POA: Diagnosis not present

## 2022-01-12 DIAGNOSIS — E119 Type 2 diabetes mellitus without complications: Secondary | ICD-10-CM | POA: Diagnosis not present

## 2022-01-12 DIAGNOSIS — I10 Essential (primary) hypertension: Secondary | ICD-10-CM | POA: Diagnosis not present

## 2022-01-29 DIAGNOSIS — D72820 Lymphocytosis (symptomatic): Secondary | ICD-10-CM | POA: Insufficient documentation

## 2022-01-29 NOTE — Progress Notes (Signed)
[place]  [phone] [phone]) [phone]  ID: Penny Myers OB: 01-18-1971  MR#: [id number]  BSJ#:[id number]  Patient Care Team: Perrin Maltese, MD as PCP - General (Internal Medicine) Rico Junker, RN as Registered Nurse Rico Junker, RN as Registered Nurse Lloyd Huger, MD as Consulting Physician (Oncology)  CHIEF COMPLAINT: Lymphocytosis.  INTERVAL HISTORY: Patient is a 51 year old female who was noted to have a normal white blood cell count, but increased lymphocyte count on routine blood work.  She currently feels well and is asymptomatic.  She denies any recent fevers or illnesses.  She has no new medications.  She has a good appetite and denies weight loss.  She has no chest pain, shortness of breath, cough, or hemoptysis.  She denies any nausea, vomiting, constipation, or diarrhea.  She has no urinary complaints.  Patient feels at her baseline and offers no specific complaints today.  REVIEW OF SYSTEMS:   Review of Systems  Constitutional: Negative.  Negative for fever, malaise/fatigue and weight loss.  Respiratory: Negative.  Negative for cough, hemoptysis and shortness of breath.   Cardiovascular: Negative.  Negative for chest pain and leg swelling.  Gastrointestinal: Negative.  Negative for abdominal pain.  Genitourinary: Negative.  Negative for dysuria.  Musculoskeletal: Negative.  Negative for back pain.  Skin: Negative.  Negative for rash.  Neurological: Negative.  Negative for dizziness, focal weakness, weakness and headaches.  Psychiatric/Behavioral: Negative.  The patient is not nervous/anxious.    As per HPI. Otherwise, a complete review of systems is negative.  PAST MEDICAL HISTORY: Past Medical History:  Diagnosis Date   Acid reflux    Anxiety    Heart murmur    Hypertension    States that she does not have HBP, but takes losartan for leaking valve.   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[place])     PAST SURGICAL  HISTORY: Past Surgical History:  Procedure Laterality Date   APPENDECTOMY     CHOLECYSTECTOMY     SHOULDER ARTHROSCOPY WITH SUBACROMIAL DECOMPRESSION Left 12/09/2015   Procedure: SHOULDER ARTHROSCOPY WITH SUBACROMIAL DECOMPRESSION;  Surgeon: Ninetta Lights, MD;  Location: [facility];  Service: Orthopedics;  Laterality: Left;   TUBAL LIGATION      FAMILY HISTORY: Family History  Problem Relation Age of Onset   Cancer Maternal Aunt    Breast cancer Maternal Aunt        40's   Gastric cancer Maternal Grandmother     ADVANCED DIRECTIVES (Y/N):  N  HEALTH MAINTENANCE: Social History   Tobacco Use   Smoking status: Every Day    Packs/day: 0.50    Types: Cigarettes   Smokeless tobacco: Never  Vaping Use   Vaping Use: Never used  Substance Use Topics   Alcohol use: Yes    Comment: occ   Drug use: Yes    Types: Marijuana     Colonoscopy:  PAP:  Bone density:  Lipid panel:  No Known Allergies  Current Outpatient Medications  Medication Sig Dispense Refill   amitriptyline (ELAVIL) 25 MG tablet Take 1 tablet by mouth at bedtime.     Azelastine HCl 137 MCG/SPRAY SOLN SMARTSIG:1-2 Spray(s) Both Nares Twice Daily     baclofen (LIORESAL) 10 MG tablet Baclofen 10 MG Oral Tablet QTY: 30 tablet Days: 30 Refills: 1  Written: 06/30/21 Patient Instructions: 1 every bedtime     diclofenac Sodium (VOLTAREN) 1 % GEL Voltaren 1% External Gel QTY: 50 gram Days: 30 Refills: 2  Written: 06/30/21 Patient Instructions: apply bid to the area for 2 weks     fluticasone (FLONASE) 50 MCG/ACT nasal spray Place into the nose.     glipiZIDE (GLUCOTROL XL) 5 MG 24 hr tablet Take by mouth.     losartan (COZAAR) 50 MG tablet Take 50 mg by mouth daily.     pantoprazole (PROTONIX) 20 MG tablet Take by mouth.     rizatriptan (MAXALT) 10 MG tablet Take by mouth.     No current facility-administered medications for this visit.    OBJECTIVE: Vitals:   02/02/22 1337  BP: (!) 135/92   Pulse: 84  Resp: 18  Temp: 97.7 F (36.5 C)  SpO2: 99%     Body mass index is 37.69 kg/m.    ECOG FS:0 - Asymptomatic  General: Well-developed, well-nourished, no acute distress. Eyes: Pink conjunctiva, anicteric sclera. HEENT: Normocephalic, moist mucous membranes. Lungs: No audible wheezing or coughing. Heart: Regular rate and rhythm. Abdomen: Soft, nontender, no obvious distention. Musculoskeletal: No edema, cyanosis, or clubbing. Neuro: Alert, answering all questions appropriately. Cranial nerves grossly intact. Skin: No rashes or petechiae noted. Psych: Normal affect. Lymphatics: No cervical, calvicular, axillary or inguinal LAD.   LAB RESULTS:  Lab Results  Component Value Date   NA 138 11/22/2019   K 3.5 11/22/2019   CL 102 11/22/2019   CO2 25 11/22/2019   GLUCOSE 142 (H) 11/22/2019   BUN 14 11/22/2019   CREATININE 1.10 (H) 09/13/2021   CALCIUM 9.4 11/22/2019   PROT 7.7 11/22/2019   ALBUMIN 4.4 11/22/2019   AST 18 11/22/2019   ALT 20 11/22/2019   ALKPHOS 71 11/22/2019   BILITOT 0.6 11/22/2019   GFRNONAA >60 11/22/2019   GFRAA >60 11/22/2019    Lab Results  Component Value Date   WBC 9.5 02/02/2022   NEUTROABS 5.3 02/02/2022   HGB 14.7 02/02/2022   HCT 45.4 02/02/2022   MCV 87.6 02/02/2022   PLT 212 02/02/2022     STUDIES: No results found.  ASSESSMENT:  Lymphocytosis.  PLAN:    Lymphocytosis: Patient's white blood cell count and lymphocyte count are within normal limits today.  Peripheral blood flow cytometry has been ordered for completeness and is pending at time of dictation.  No intervention is needed at this time.  Patient does not require bone marrow biopsy.  She will have a video assisted telemedicine visit in approximately 3 weeks for further evaluation and discussion of her laboratory results.  I spent a total of 45 minutes reviewing chart data, face-to-face evaluation with the patient, counseling and coordination of care as detailed  above.   Patient expressed understanding and was in agreement with this plan. She also understands that She can call clinic at any time with any questions, concerns, or complaints.    Lloyd Huger, MD   02/03/2022 2:59 PM

## 2022-02-02 ENCOUNTER — Inpatient Hospital Stay: Payer: 59 | Attending: Oncology | Admitting: Oncology

## 2022-02-02 ENCOUNTER — Encounter: Payer: Self-pay | Admitting: Oncology

## 2022-02-02 ENCOUNTER — Inpatient Hospital Stay: Payer: 59

## 2022-02-02 DIAGNOSIS — D72820 Lymphocytosis (symptomatic): Secondary | ICD-10-CM

## 2022-02-02 DIAGNOSIS — L603 Nail dystrophy: Secondary | ICD-10-CM | POA: Diagnosis not present

## 2022-02-02 DIAGNOSIS — F17210 Nicotine dependence, cigarettes, uncomplicated: Secondary | ICD-10-CM | POA: Insufficient documentation

## 2022-02-02 DIAGNOSIS — R69 Illness, unspecified: Secondary | ICD-10-CM | POA: Diagnosis not present

## 2022-02-02 LAB — CBC WITH DIFFERENTIAL/PLATELET
Abs Immature Granulocytes: 0.02 10*3/uL (ref 0.00–0.07)
Basophils Absolute: 0.1 10*3/uL (ref 0.0–0.1)
Basophils Relative: 1 %
Eosinophils Absolute: 0.1 10*3/uL (ref 0.0–0.5)
Eosinophils Relative: 1 %
HCT: 45.4 % (ref 36.0–46.0)
Hemoglobin: 14.7 g/dL (ref 12.0–15.0)
Immature Granulocytes: 0 %
Lymphocytes Relative: 38 %
Lymphs Abs: 3.6 10*3/uL (ref 0.7–4.0)
MCH: 28.4 pg (ref 26.0–34.0)
MCHC: 32.4 g/dL (ref 30.0–36.0)
MCV: 87.6 fL (ref 80.0–100.0)
Monocytes Absolute: 0.4 10*3/uL (ref 0.1–1.0)
Monocytes Relative: 4 %
Neutro Abs: 5.3 10*3/uL (ref 1.7–7.7)
Neutrophils Relative %: 56 %
Platelets: 212 10*3/uL (ref 150–400)
RBC: 5.18 MIL/uL — ABNORMAL HIGH (ref 3.87–5.11)
RDW: 14.1 % (ref 11.5–15.5)
WBC: 9.5 10*3/uL (ref 4.0–10.5)
nRBC: 0 % (ref 0.0–0.2)

## 2022-02-06 LAB — COMP PANEL: LEUKEMIA/LYMPHOMA

## 2022-02-20 NOTE — Progress Notes (Unsigned)
[place]  Telephone:(336) [phone] Fax:(336) [phone]  ID: Penny Myers OB: 1971/01/09  MR#: [id number]  TGG#:[id number]  Patient Care Team: Perrin Maltese, MD as PCP - General (Internal Medicine) Rico Junker, RN as Registered Nurse Rico Junker, RN as Registered Nurse Lloyd Huger, MD as Consulting Physician (Oncology)  I connected with Penny Myers on 02/22/22 at  2:00 PM EDT by video enabled telemedicine visit and verified that I am speaking with the correct person using two identifiers.   I discussed the limitations, risks, security and privacy concerns of performing an evaluation and management service by telemedicine and the availability of in-person appointments. I also discussed with the patient that there may be a patient responsible charge related to this service. The patient expressed understanding and agreed to proceed.   Other persons participating in the visit and their role in the encounter: Patient, MD.  Patient's location: Home. Provider's location: Clinic.  CHIEF COMPLAINT: Lymphocytosis.  INTERVAL HISTORY: Patient agreed to video assisted telemedicine visit for further evaluation and discussion of her laboratory results.  Patient's video was having technical difficulties, therefore call was transitioned to audio only.  She continues to feel well and remains asymptomatic. She denies any recent fevers or illnesses.  She has a good appetite and denies weight loss.  She has no chest pain, shortness of breath, cough, or hemoptysis.  She denies any nausea, vomiting, constipation, or diarrhea.  She has no urinary complaints.  Patient offers no specific complaints today.  REVIEW OF SYSTEMS:   Review of Systems  Constitutional: Negative.  Negative for fever, malaise/fatigue and weight loss.  Respiratory: Negative.  Negative for cough, hemoptysis and shortness of breath.   Cardiovascular: Negative.  Negative for chest pain and leg  swelling.  Gastrointestinal: Negative.  Negative for abdominal pain.  Genitourinary: Negative.  Negative for dysuria.  Musculoskeletal: Negative.  Negative for back pain.  Skin: Negative.  Negative for rash.  Neurological: Negative.  Negative for dizziness, focal weakness, weakness and headaches.  Psychiatric/Behavioral: Negative.  The patient is not nervous/anxious.    As per HPI. Otherwise, a complete review of systems is negative.  PAST MEDICAL HISTORY: Past Medical History:  Diagnosis Date   Acid reflux    Anxiety    Heart murmur    Hypertension    States that she does not have HBP, but takes losartan for leaking valve.   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[place])     PAST SURGICAL HISTORY: Past Surgical History:  Procedure Laterality Date   APPENDECTOMY     CHOLECYSTECTOMY     SHOULDER ARTHROSCOPY WITH SUBACROMIAL DECOMPRESSION Left 12/09/2015   Procedure: SHOULDER ARTHROSCOPY WITH SUBACROMIAL DECOMPRESSION;  Surgeon: Ninetta Lights, MD;  Location: [facility];  Service: Orthopedics;  Laterality: Left;   TUBAL LIGATION      FAMILY HISTORY: Family History  Problem Relation Age of Onset   Cancer Maternal Aunt    Breast cancer Maternal Aunt        40's   Gastric cancer Maternal Grandmother     ADVANCED DIRECTIVES (Y/N):  N  HEALTH MAINTENANCE: Social History   Tobacco Use   Smoking status: Every Day    Packs/day: 0.50    Types: Cigarettes   Smokeless tobacco: Never  Vaping Use   Vaping Use: Never used  Substance Use Topics   Alcohol use: Yes    Comment: occ   Drug use: Yes    Types: Marijuana  Colonoscopy:  PAP:  Bone density:  Lipid panel:  No Known Allergies  Current Outpatient Medications  Medication Sig Dispense Refill   amitriptyline (ELAVIL) 25 MG tablet Take 1 tablet by mouth at bedtime.     Azelastine HCl 137 MCG/SPRAY SOLN SMARTSIG:1-2 Spray(s) Both Nares Twice Daily     baclofen (LIORESAL) 10 MG tablet Baclofen 10 MG Oral  Tablet QTY: 30 tablet Days: 30 Refills: 1  Written: 06/30/21 Patient Instructions: 1 every bedtime     diclofenac Sodium (VOLTAREN) 1 % GEL Voltaren 1% External Gel QTY: 50 gram Days: 30 Refills: 2  Written: 06/30/21 Patient Instructions: apply bid to the area for 2 weks     fluticasone (FLONASE) 50 MCG/ACT nasal spray Place into the nose.     glipiZIDE (GLUCOTROL XL) 5 MG 24 hr tablet Take by mouth.     losartan (COZAAR) 50 MG tablet Take 50 mg by mouth daily.     pantoprazole (PROTONIX) 20 MG tablet Take by mouth.     rizatriptan (MAXALT) 10 MG tablet Take by mouth.     No current facility-administered medications for this visit.    OBJECTIVE: There were no vitals filed for this visit.    There is no height or weight on file to calculate BMI.    ECOG FS:0 - Asymptomatic  LAB RESULTS:  Lab Results  Component Value Date   NA 138 11/22/2019   K 3.5 11/22/2019   CL 102 11/22/2019   CO2 25 11/22/2019   GLUCOSE 142 (H) 11/22/2019   BUN 14 11/22/2019   CREATININE 1.10 (H) 09/13/2021   CALCIUM 9.4 11/22/2019   PROT 7.7 11/22/2019   ALBUMIN 4.4 11/22/2019   AST 18 11/22/2019   ALT 20 11/22/2019   ALKPHOS 71 11/22/2019   BILITOT 0.6 11/22/2019   GFRNONAA >60 11/22/2019   GFRAA >60 11/22/2019    Lab Results  Component Value Date   WBC 9.5 02/02/2022   NEUTROABS 5.3 02/02/2022   HGB 14.7 02/02/2022   HCT 45.4 02/02/2022   MCV 87.6 02/02/2022   PLT 212 02/02/2022     STUDIES: No results found.  ASSESSMENT:  Lymphocytosis.  PLAN:    Lymphocytosis: Patient's most recent white blood cell count and lymphocyte count are within normal limits today.  Peripheral blood flow cytometry is negative.  No intervention is needed at this time.  Patient does not require bone marrow biopsy.  No further follow-up is necessary.  Please refer patient back if there are any questions or concerns.  I provided 20 minutes of face-to-face video visit time during this encounter which included chart  review, counseling, and coordination of care as documented above.    Patient expressed understanding and was in agreement with this plan. She also understands that She can call clinic at any time with any questions, concerns, or complaints.    Lloyd Huger, MD   02/22/2022 2:51 PM

## 2022-02-22 ENCOUNTER — Inpatient Hospital Stay: Payer: 59 | Attending: Oncology | Admitting: Oncology

## 2022-02-22 DIAGNOSIS — D72820 Lymphocytosis (symptomatic): Secondary | ICD-10-CM

## 2022-03-02 IMAGING — CT CT HEAD WO/W CM
4 of 5 series · 15 of 47 positions shown, 17 images · IV contrast (omnipaque)
Comparison: None.

CLINICAL DATA: Numbness of mouth/dizziness.

EXAM:
CT HEAD WITHOUT AND WITH CONTRAST
TECHNIQUE: Contiguous axial images were obtained from the base of the skull
through the vertex without and with intravenous contrast
CONTRAST:  80mL OMNIPAQUE IOHEXOL 350 MG/ML SOLN

[Series 2: head wo · axial · 0.47mm/px · z∈[+1638,+1728]mm · 4 of 32 slices shown]
[im 7/32  brain]
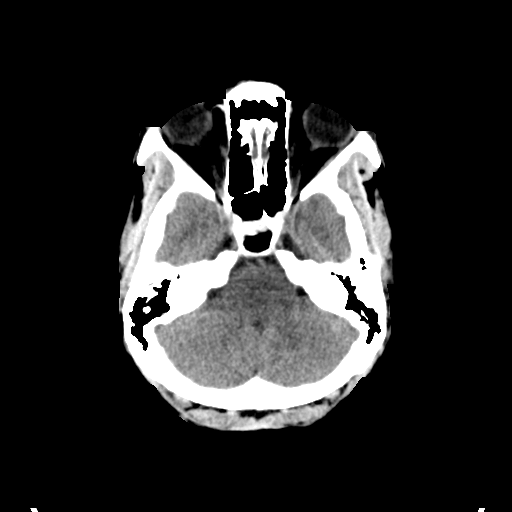
[im 13/32  brain]
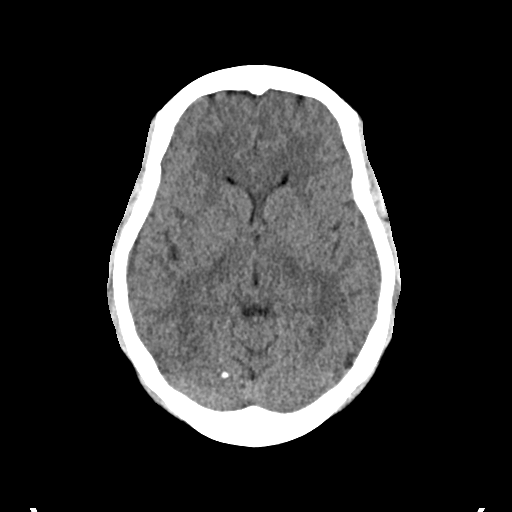
[im 19/32  brain]
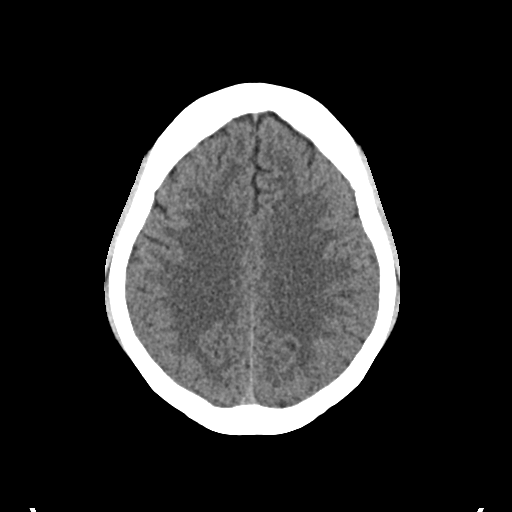
[im 25/32  brain]
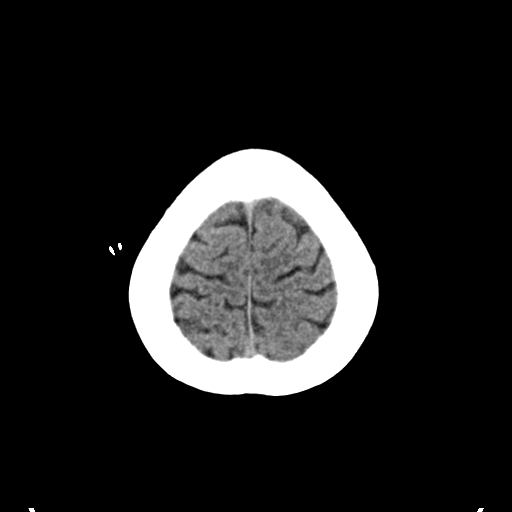

[Series 4: head w · axial · 0.47mm/px · z∈[+1632,+1732]mm · 5 of 32 slices shown, 7 images]
[im 6/32  brain]
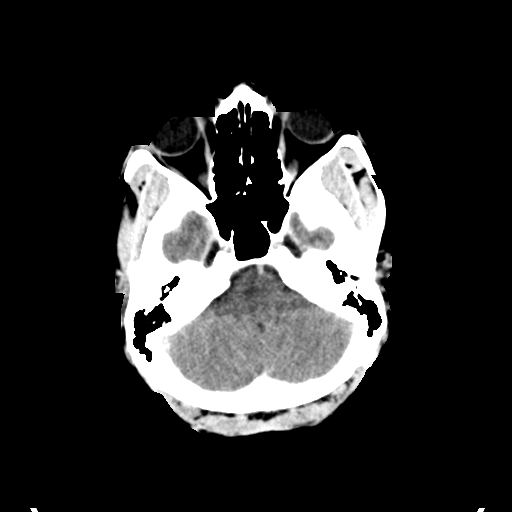
[im 6/32  bone]
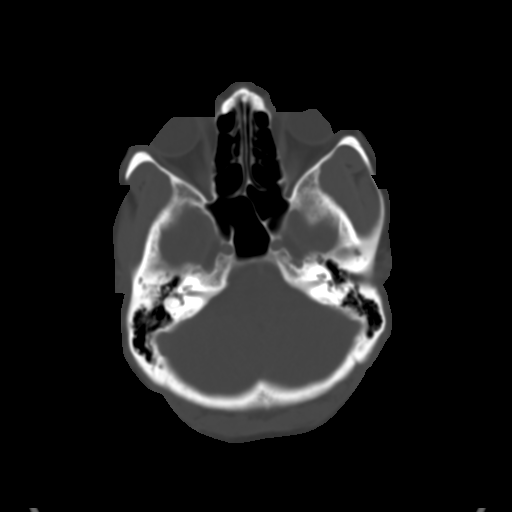
[im 11/32  brain]
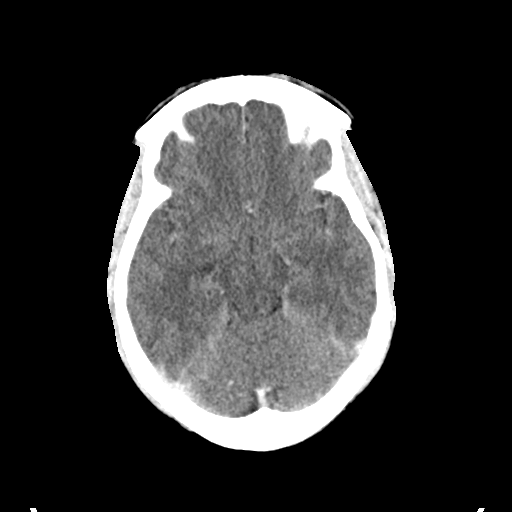
[im 16/32  brain]
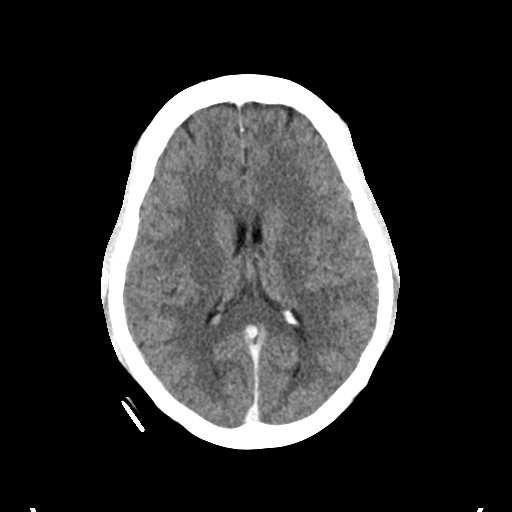
[im 21/32  brain]
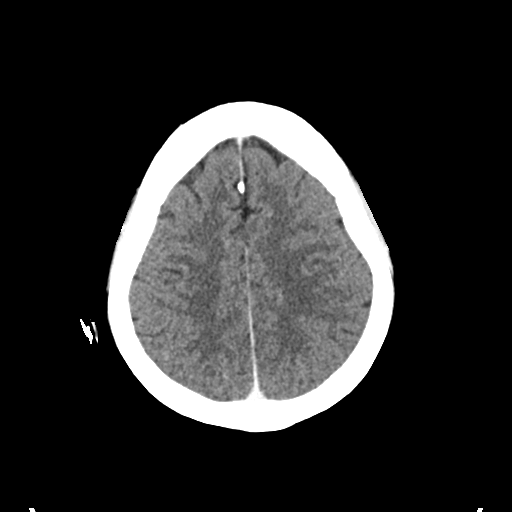
[im 26/32  brain]
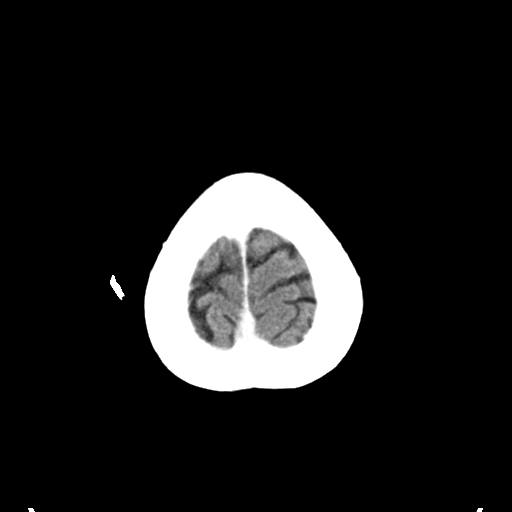
[im 26/32  bone]
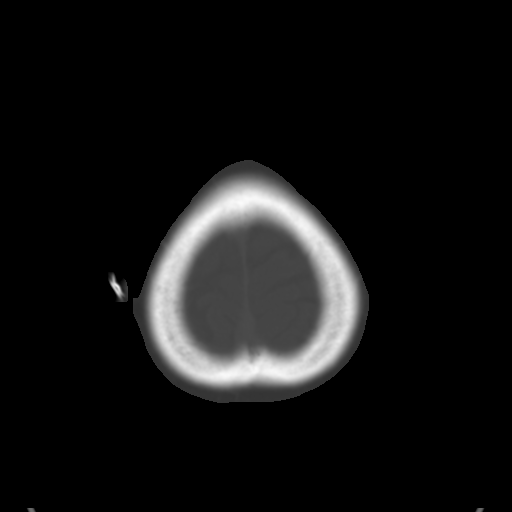

[Series 5: coronal soft tissue · coronal · 0.30mm/px · 3 of 62 slices shown]
[im 21/62  brain]
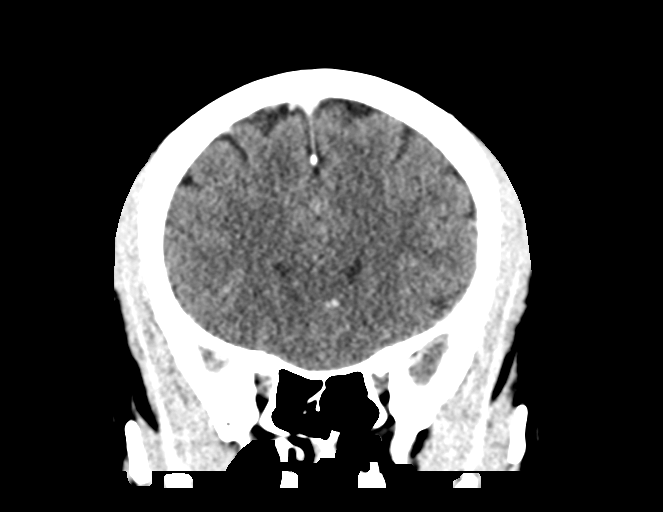
[im 28/62  brain]
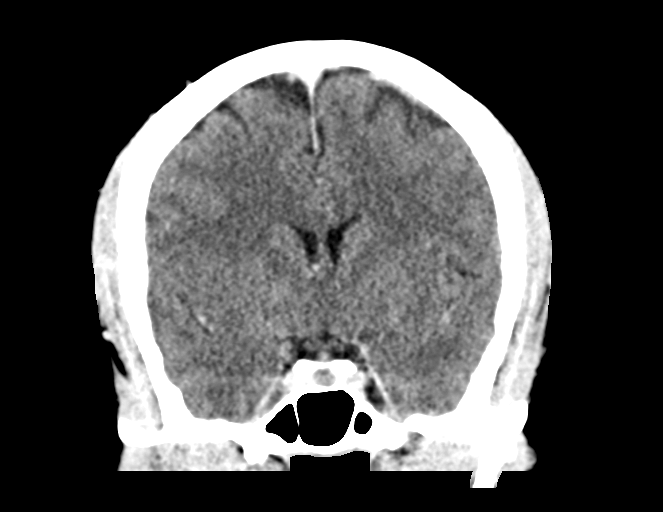
[im 34/62  brain]
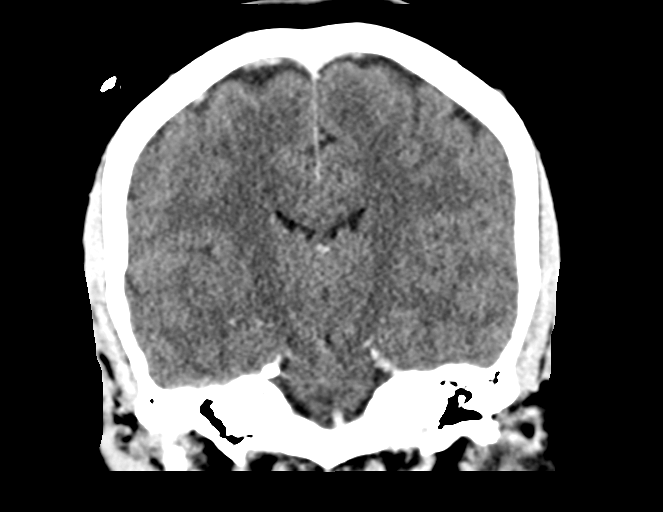

[Series 6: sagittal soft tissue · sagittal · 0.30mm/px · 3 of 52 slices shown]
[im 18/52  brain]
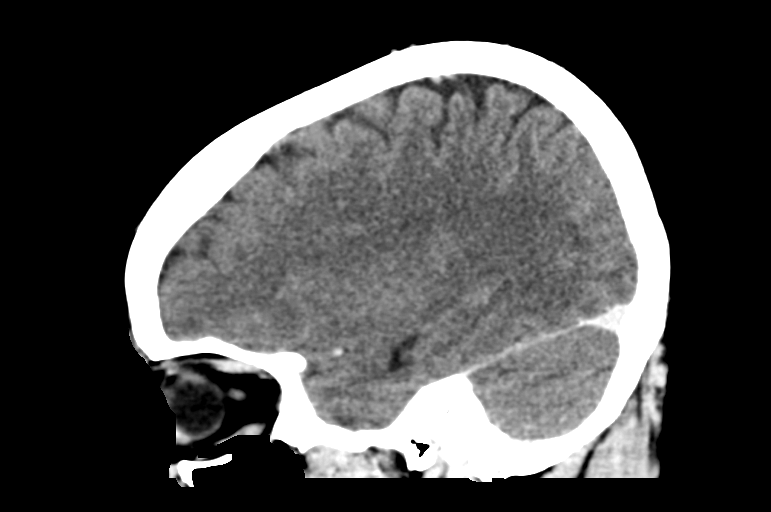
[im 26/52  brain]
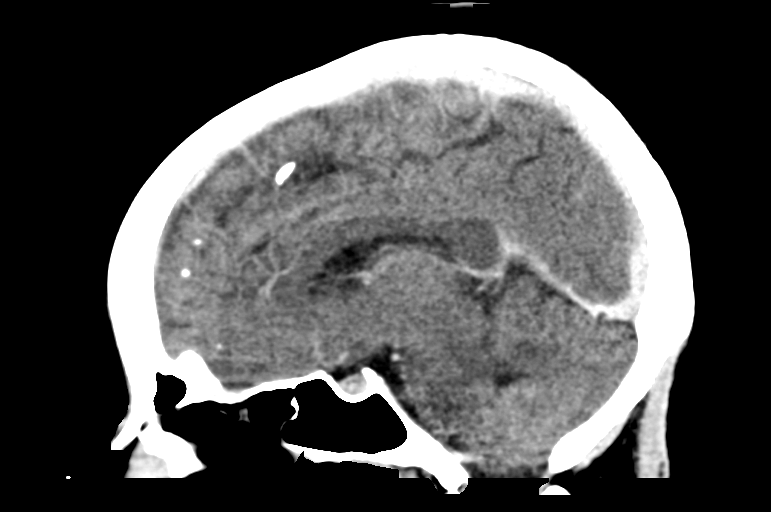
[im 35/52  brain]
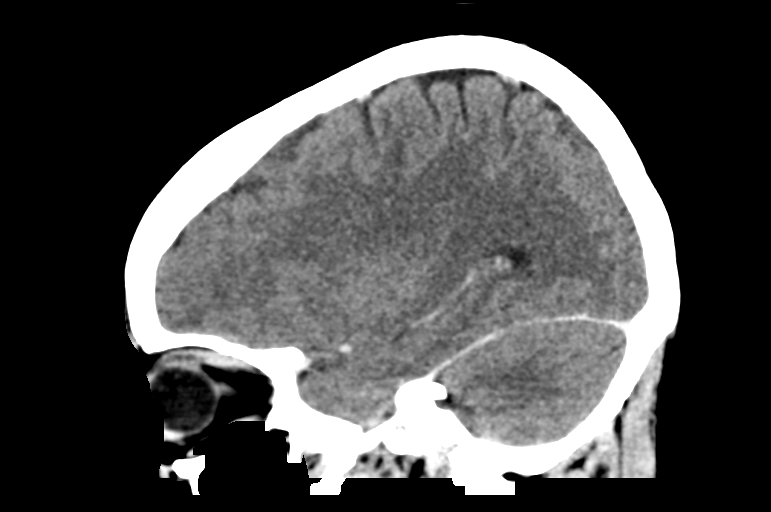

[15 of 47 positions shown; findings below may reference images not displayed]

FINDINGS: Brain: No evidence of acute infarction, hemorrhage, hydrocephalus,
extra-axial collection or mass lesion/mass effect. No evidence of
abnormal enhancement.

Vascular: No hyperdense vessel or unexpected calcification. Visible
vessels are patent.

Skull: No acute fracture.

Sinuses/Orbits: No acute finding.

Other: No mastoid effusions.
IMPRESSION: No evidence of acute intracranial abnormality.

## 2022-03-20 DIAGNOSIS — L814 Other melanin hyperpigmentation: Secondary | ICD-10-CM | POA: Diagnosis not present

## 2022-03-20 DIAGNOSIS — L298 Other pruritus: Secondary | ICD-10-CM | POA: Diagnosis not present

## 2022-03-31 DIAGNOSIS — E119 Type 2 diabetes mellitus without complications: Secondary | ICD-10-CM | POA: Diagnosis not present

## 2022-03-31 DIAGNOSIS — I10 Essential (primary) hypertension: Secondary | ICD-10-CM | POA: Diagnosis not present

## 2022-03-31 DIAGNOSIS — G43009 Migraine without aura, not intractable, without status migrainosus: Secondary | ICD-10-CM | POA: Diagnosis not present

## 2022-03-31 DIAGNOSIS — E782 Mixed hyperlipidemia: Secondary | ICD-10-CM | POA: Diagnosis not present

## 2022-04-03 ENCOUNTER — Other Ambulatory Visit: Payer: Self-pay | Admitting: Internal Medicine

## 2022-04-03 DIAGNOSIS — Z1231 Encounter for screening mammogram for malignant neoplasm of breast: Secondary | ICD-10-CM

## 2022-04-06 IMAGING — CR DG CHEST 2V
1 series · 2 of 2 positions shown · non-contrast
Comparison: Chest radiograph 06/30/2021

CLINICAL DATA: Productive cough, tightness in the chest, shortness
of breath, current smoker

EXAM:
CHEST - 2 VIEW

[Series 1: dg chest 2 view · 0.14mm/px · 2 of 2 slices shown]
[im 1/2]
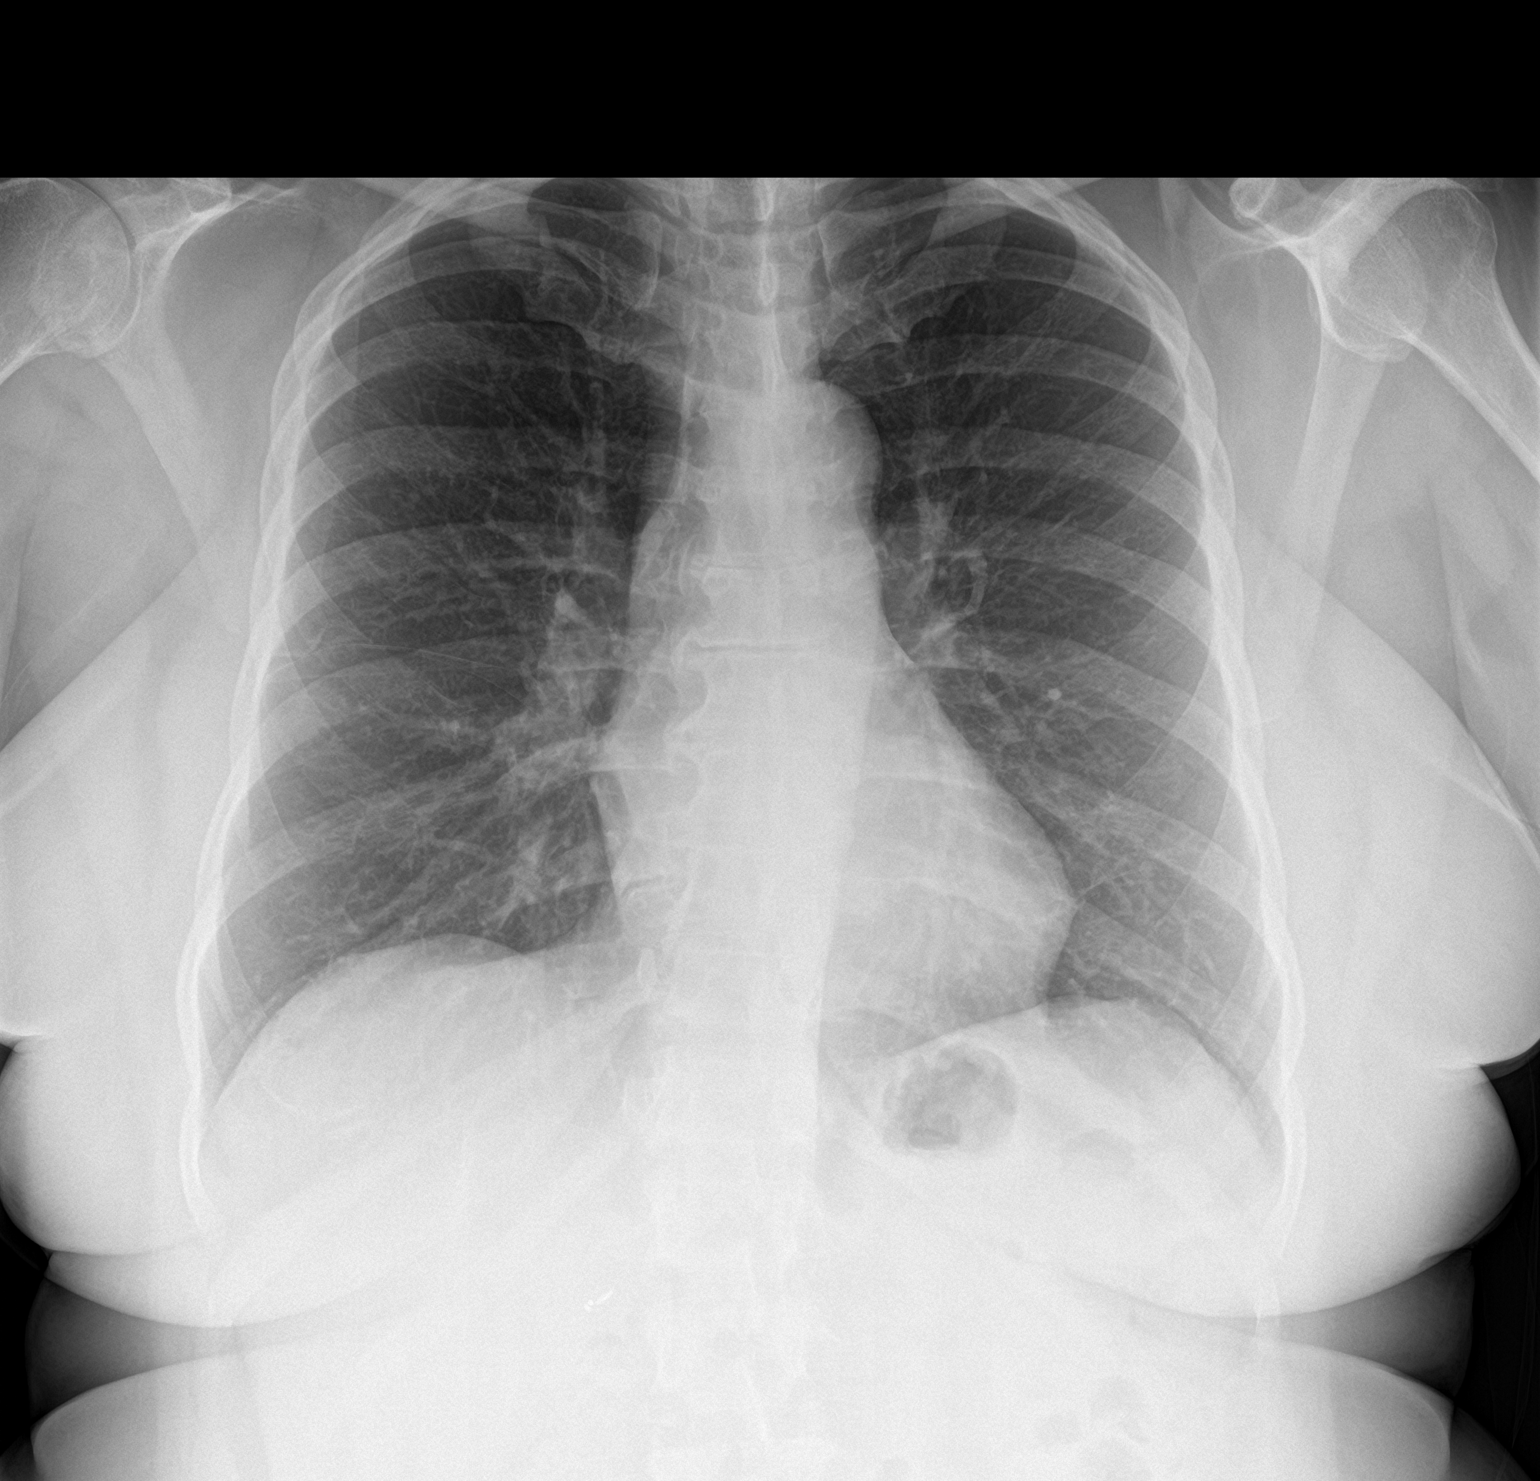
[im 2/2]
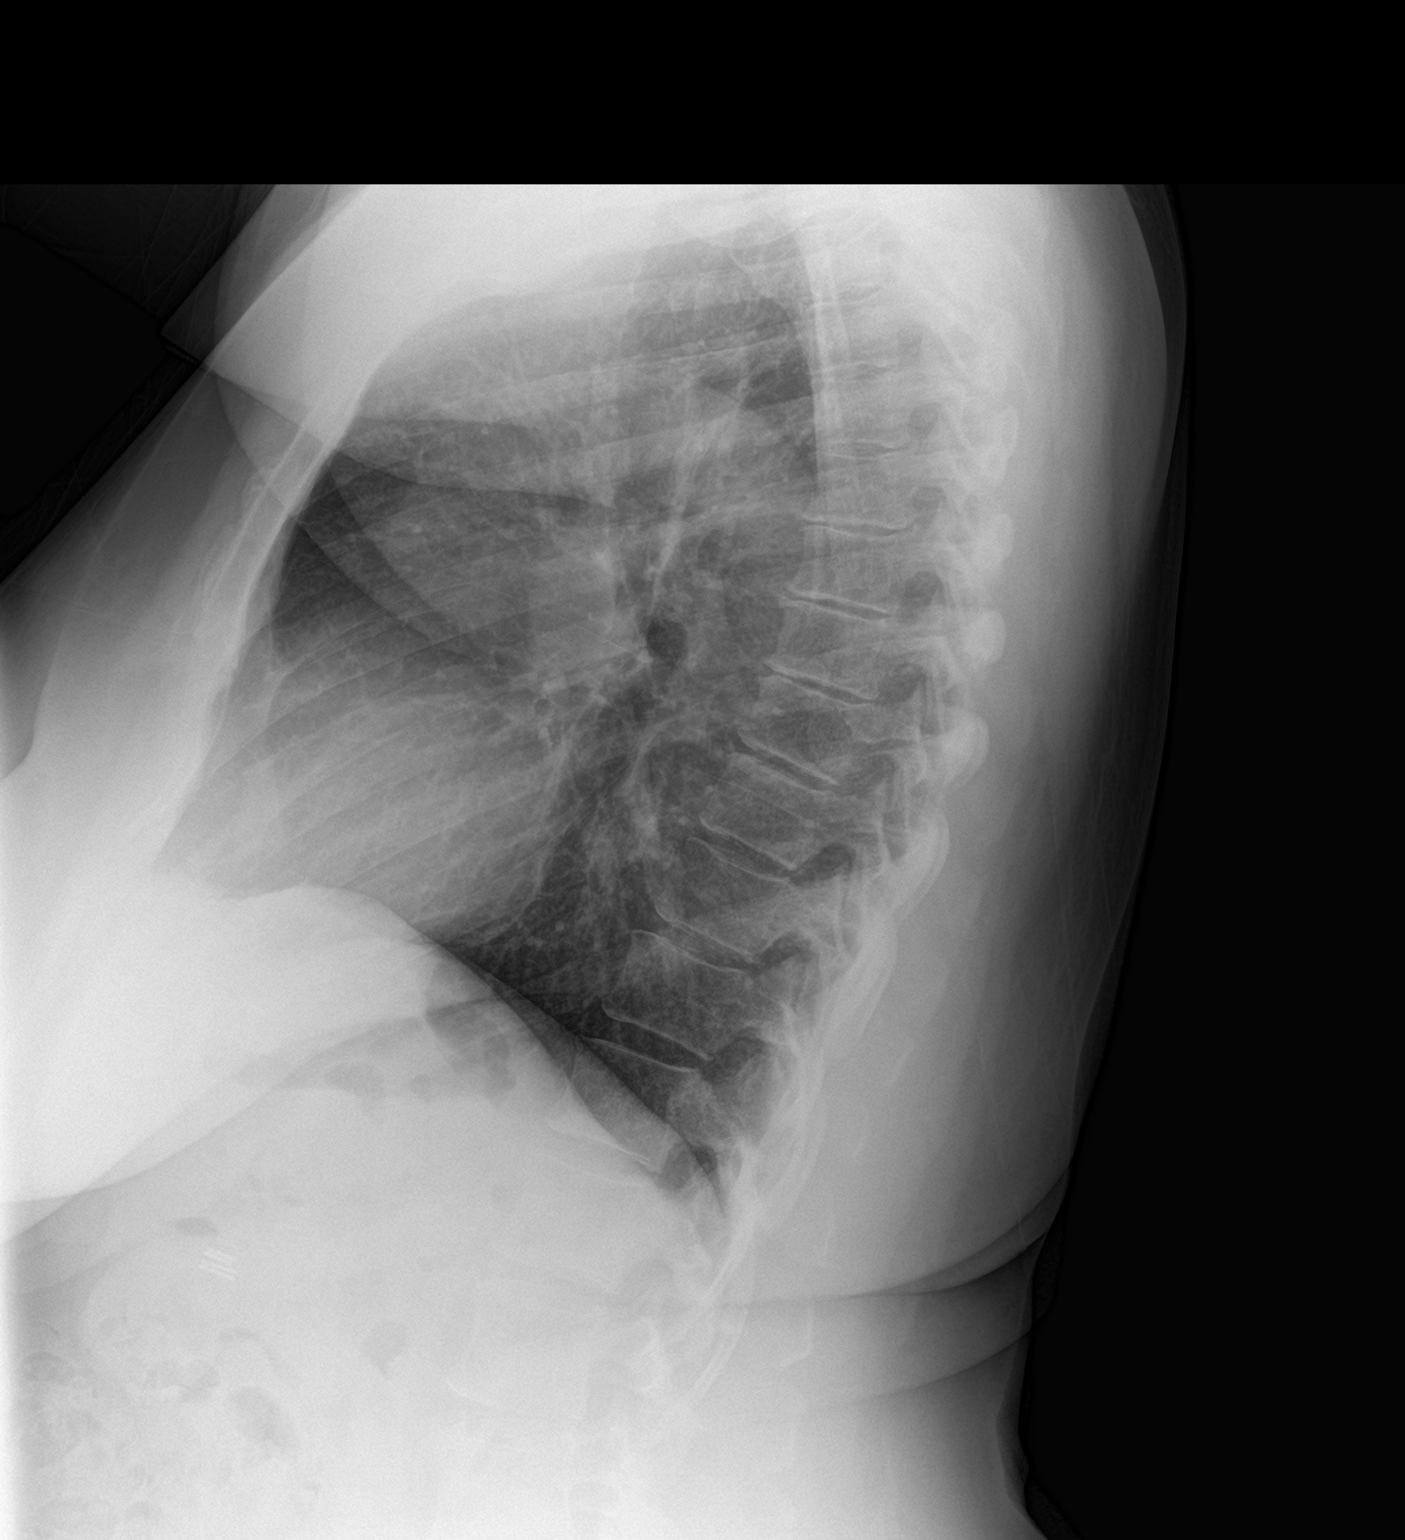

[2 of 2 positions shown; findings below may reference images not displayed]

FINDINGS: The cardiomediastinal contours are within normal limits. The lungs
are clear. No pneumothorax or pleural effusion. No acute finding in
the visualized skeleton.
IMPRESSION: No acute cardiopulmonary process.

## 2022-04-10 DIAGNOSIS — I10 Essential (primary) hypertension: Secondary | ICD-10-CM | POA: Diagnosis not present

## 2022-04-10 DIAGNOSIS — E782 Mixed hyperlipidemia: Secondary | ICD-10-CM | POA: Diagnosis not present

## 2022-04-10 DIAGNOSIS — E119 Type 2 diabetes mellitus without complications: Secondary | ICD-10-CM | POA: Diagnosis not present

## 2022-04-24 ENCOUNTER — Ambulatory Visit
Admission: RE | Admit: 2022-04-24 | Discharge: 2022-04-24 | Disposition: A | Discharge status: Home / Self Care | Payer: 59 | Source: Ambulatory Visit | Attending: Internal Medicine | Admitting: Internal Medicine

## 2022-04-24 DIAGNOSIS — Z1231 Encounter for screening mammogram for malignant neoplasm of breast: Secondary | ICD-10-CM

## 2022-04-26 ENCOUNTER — Other Ambulatory Visit: Payer: Self-pay | Admitting: Internal Medicine

## 2022-04-26 DIAGNOSIS — R928 Other abnormal and inconclusive findings on diagnostic imaging of breast: Secondary | ICD-10-CM

## 2022-05-02 DIAGNOSIS — W57XXXA Bitten or stung by nonvenomous insect and other nonvenomous arthropods, initial encounter: Secondary | ICD-10-CM | POA: Diagnosis not present

## 2022-05-02 DIAGNOSIS — R21 Rash and other nonspecific skin eruption: Secondary | ICD-10-CM | POA: Diagnosis not present

## 2022-05-02 DIAGNOSIS — S40861A Insect bite (nonvenomous) of right upper arm, initial encounter: Secondary | ICD-10-CM | POA: Diagnosis not present

## 2022-05-04 ENCOUNTER — Ambulatory Visit
Admission: RE | Admit: 2022-05-04 | Discharge: 2022-05-04 | Disposition: A | Discharge status: Home / Self Care | Payer: 59 | Source: Ambulatory Visit | Attending: Internal Medicine | Admitting: Internal Medicine

## 2022-05-04 ENCOUNTER — Encounter: Payer: Self-pay | Admitting: Diagnostic Radiology

## 2022-05-04 DIAGNOSIS — R928 Other abnormal and inconclusive findings on diagnostic imaging of breast: Secondary | ICD-10-CM

## 2022-05-04 DIAGNOSIS — N6324 Unspecified lump in the left breast, lower inner quadrant: Secondary | ICD-10-CM | POA: Diagnosis not present

## 2022-05-15 DIAGNOSIS — E782 Mixed hyperlipidemia: Secondary | ICD-10-CM | POA: Diagnosis not present

## 2022-05-15 DIAGNOSIS — I10 Essential (primary) hypertension: Secondary | ICD-10-CM | POA: Diagnosis not present

## 2022-05-15 DIAGNOSIS — M2550 Pain in unspecified joint: Secondary | ICD-10-CM | POA: Diagnosis not present

## 2022-06-01 DIAGNOSIS — H6693 Otitis media, unspecified, bilateral: Secondary | ICD-10-CM | POA: Diagnosis not present

## 2022-06-01 DIAGNOSIS — Z23 Encounter for immunization: Secondary | ICD-10-CM | POA: Diagnosis not present

## 2022-06-05 DIAGNOSIS — M5481 Occipital neuralgia: Secondary | ICD-10-CM | POA: Diagnosis not present

## 2022-06-05 DIAGNOSIS — G43709 Chronic migraine without aura, not intractable, without status migrainosus: Secondary | ICD-10-CM | POA: Diagnosis not present

## 2022-07-03 DIAGNOSIS — E782 Mixed hyperlipidemia: Secondary | ICD-10-CM | POA: Diagnosis not present

## 2022-07-03 DIAGNOSIS — I10 Essential (primary) hypertension: Secondary | ICD-10-CM | POA: Diagnosis not present

## 2022-07-03 DIAGNOSIS — R7302 Impaired glucose tolerance (oral): Secondary | ICD-10-CM | POA: Diagnosis not present

## 2022-07-03 DIAGNOSIS — E119 Type 2 diabetes mellitus without complications: Secondary | ICD-10-CM | POA: Diagnosis not present

## 2022-07-03 DIAGNOSIS — R69 Illness, unspecified: Secondary | ICD-10-CM | POA: Diagnosis not present

## 2022-07-19 DIAGNOSIS — Z79899 Other long term (current) drug therapy: Secondary | ICD-10-CM | POA: Diagnosis not present

## 2022-07-19 DIAGNOSIS — Z8249 Family history of ischemic heart disease and other diseases of the circulatory system: Secondary | ICD-10-CM | POA: Diagnosis not present

## 2022-07-19 DIAGNOSIS — E119 Type 2 diabetes mellitus without complications: Secondary | ICD-10-CM | POA: Diagnosis not present

## 2022-07-19 DIAGNOSIS — R69 Illness, unspecified: Secondary | ICD-10-CM | POA: Diagnosis not present

## 2022-07-19 DIAGNOSIS — G8929 Other chronic pain: Secondary | ICD-10-CM | POA: Diagnosis not present

## 2022-07-19 DIAGNOSIS — J301 Allergic rhinitis due to pollen: Secondary | ICD-10-CM | POA: Diagnosis not present

## 2022-07-19 DIAGNOSIS — Z7984 Long term (current) use of oral hypoglycemic drugs: Secondary | ICD-10-CM | POA: Diagnosis not present

## 2022-07-19 DIAGNOSIS — I10 Essential (primary) hypertension: Secondary | ICD-10-CM | POA: Diagnosis not present

## 2022-07-19 DIAGNOSIS — K219 Gastro-esophageal reflux disease without esophagitis: Secondary | ICD-10-CM | POA: Diagnosis not present

## 2022-07-19 DIAGNOSIS — Z833 Family history of diabetes mellitus: Secondary | ICD-10-CM | POA: Diagnosis not present

## 2022-07-28 DIAGNOSIS — E782 Mixed hyperlipidemia: Secondary | ICD-10-CM | POA: Diagnosis not present

## 2022-07-28 DIAGNOSIS — R7302 Impaired glucose tolerance (oral): Secondary | ICD-10-CM | POA: Diagnosis not present

## 2022-07-28 DIAGNOSIS — I10 Essential (primary) hypertension: Secondary | ICD-10-CM | POA: Diagnosis not present

## 2022-08-03 DIAGNOSIS — E782 Mixed hyperlipidemia: Secondary | ICD-10-CM | POA: Diagnosis not present

## 2022-08-03 DIAGNOSIS — I10 Essential (primary) hypertension: Secondary | ICD-10-CM | POA: Diagnosis not present

## 2022-08-03 DIAGNOSIS — K2100 Gastro-esophageal reflux disease with esophagitis, without bleeding: Secondary | ICD-10-CM | POA: Diagnosis not present

## 2022-08-03 DIAGNOSIS — E119 Type 2 diabetes mellitus without complications: Secondary | ICD-10-CM | POA: Diagnosis not present

## 2022-08-03 DIAGNOSIS — R69 Illness, unspecified: Secondary | ICD-10-CM | POA: Diagnosis not present

## 2022-08-17 DIAGNOSIS — E782 Mixed hyperlipidemia: Secondary | ICD-10-CM | POA: Diagnosis not present

## 2022-08-17 DIAGNOSIS — B3731 Acute candidiasis of vulva and vagina: Secondary | ICD-10-CM | POA: Diagnosis not present

## 2022-08-17 DIAGNOSIS — I10 Essential (primary) hypertension: Secondary | ICD-10-CM | POA: Diagnosis not present

## 2022-08-17 DIAGNOSIS — L2089 Other atopic dermatitis: Secondary | ICD-10-CM | POA: Diagnosis not present

## 2022-08-30 DIAGNOSIS — G43709 Chronic migraine without aura, not intractable, without status migrainosus: Secondary | ICD-10-CM | POA: Diagnosis not present

## 2022-09-28 DIAGNOSIS — I10 Essential (primary) hypertension: Secondary | ICD-10-CM | POA: Diagnosis not present

## 2022-09-28 DIAGNOSIS — R051 Acute cough: Secondary | ICD-10-CM | POA: Diagnosis not present

## 2022-09-28 DIAGNOSIS — Z6836 Body mass index (BMI) 36.0-36.9, adult: Secondary | ICD-10-CM | POA: Diagnosis not present

## 2022-09-28 DIAGNOSIS — U071 COVID-19: Secondary | ICD-10-CM | POA: Diagnosis not present

## 2022-10-07 ENCOUNTER — Emergency Department (HOSPITAL_COMMUNITY)
Admission: EM | Admit: 2022-10-07 | Discharge: 2022-10-07 | Disposition: A | Discharge status: Home / Self Care | Payer: Self-pay | Attending: Emergency Medicine | Admitting: Emergency Medicine

## 2022-10-07 ENCOUNTER — Emergency Department (HOSPITAL_COMMUNITY): Payer: Self-pay

## 2022-10-07 ENCOUNTER — Encounter (HOSPITAL_COMMUNITY): Payer: Self-pay | Admitting: Emergency Medicine

## 2022-10-07 ENCOUNTER — Other Ambulatory Visit: Payer: Self-pay

## 2022-10-07 DIAGNOSIS — Z7984 Long term (current) use of oral hypoglycemic drugs: Secondary | ICD-10-CM | POA: Insufficient documentation

## 2022-10-07 DIAGNOSIS — I10 Essential (primary) hypertension: Secondary | ICD-10-CM | POA: Insufficient documentation

## 2022-10-07 DIAGNOSIS — F419 Anxiety disorder, unspecified: Secondary | ICD-10-CM | POA: Insufficient documentation

## 2022-10-07 DIAGNOSIS — R0789 Other chest pain: Secondary | ICD-10-CM | POA: Insufficient documentation

## 2022-10-07 DIAGNOSIS — Z8616 Personal history of COVID-19: Secondary | ICD-10-CM | POA: Insufficient documentation

## 2022-10-07 LAB — CBC
HCT: 43.5 % (ref 36.0–46.0)
Hemoglobin: 13.7 g/dL (ref 12.0–15.0)
MCH: 28.5 pg (ref 26.0–34.0)
MCHC: 31.5 g/dL (ref 30.0–36.0)
MCV: 90.6 fL (ref 80.0–100.0)
Platelets: 195 10*3/uL (ref 150–400)
RBC: 4.8 MIL/uL (ref 3.87–5.11)
RDW: 13.6 % (ref 11.5–15.5)
WBC: 7.6 10*3/uL (ref 4.0–10.5)
nRBC: 0 % (ref 0.0–0.2)

## 2022-10-07 LAB — BASIC METABOLIC PANEL
Anion gap: 8 (ref 5–15)
BUN: 11 mg/dL (ref 6–20)
CO2: 26 mmol/L (ref 22–32)
Calcium: 9 mg/dL (ref 8.9–10.3)
Chloride: 107 mmol/L (ref 98–111)
Creatinine, Ser: 1.13 mg/dL — ABNORMAL HIGH (ref 0.44–1.00)
GFR, Estimated: 58 mL/min — ABNORMAL LOW (ref >60)
Glucose, Bld: 120 mg/dL — ABNORMAL HIGH (ref 70–99)
Potassium: 3.8 mmol/L (ref 3.5–5.1)
Sodium: 141 mmol/L (ref 135–145)

## 2022-10-07 LAB — I-STAT BETA HCG BLOOD, ED (MC, WL, AP ONLY): I-stat hCG, quantitative: <5 m[IU]/mL (ref <5)

## 2022-10-07 LAB — TROPONIN I (HIGH SENSITIVITY)
Troponin I (High Sensitivity): 2 ng/L (ref <18)
Troponin I (High Sensitivity): 4 ng/L (ref <18)

## 2022-10-07 MED ORDER — KETOROLAC TROMETHAMINE 30 MG/ML IJ SOLN
30.0000 mg | Freq: Once | INTRAMUSCULAR | Status: AC
  Administered 2022-10-07: 30 mg via INTRAMUSCULAR
  Filled 2022-10-07: qty 1

## 2022-10-07 MED ORDER — METOCLOPRAMIDE HCL 5 MG/ML IJ SOLN
10.0000 mg | Freq: Once | INTRAMUSCULAR | Status: AC
  Administered 2022-10-07: 10 mg via INTRAMUSCULAR
  Filled 2022-10-07: qty 2

## 2022-10-07 MED ORDER — HYDROXYZINE HCL 25 MG PO TABS: 25.0000 mg | ORAL_TABLET | Freq: Three times a day (TID) | ORAL | 0 refills | Status: AC | PRN

## 2022-10-07 NOTE — Discharge Instructions (Signed)
Thank you for letting us take care of you today.  Your lab work, chest x-ray, EKG, vital signs, and physical exam were reassuring and we do not see any problems with your heart today. As discussed, I suspect your symptoms are related to your anxiety and recommend you follow up with your PCP early next week to discuss better long-term control of your anxiety. I have prescribed a medication you can take in the meantime as needed.  If you develop worsening chest pain, shortness of breath, loss of consciousness, or other concerns, please be re-evaluated in nearest emergency department.

## 2022-10-07 NOTE — ED Triage Notes (Signed)
Pt reports burning in her chest and left arm pain that started this morning. Pt also reports headache.

## 2022-10-07 NOTE — ED Provider Notes (Signed)
[facility] Provider Note   CSN: [id number] Arrival date & time: 10/07/22  [id number]     History  Chief Complaint  Patient presents with   Chest Pain    Penny Myers is a 52 y.o. female with past medical history hypertension and generalized anxiety disorder who presents to the ED complaining of chest pressure that began this morning while she was at rest.  Patient reports that pressure is diffusely across anterior chest radiating down both arms and is associated with a bilateral occipital headache.  She has a history of migraine headaches and is on amitriptyline daily for this but did recently miss several days while sick with COVID-19.  She restarted this medication last night.  She states that she believes symptoms are all related to anxiety and that she has been panicking give her something being wrong with her heart.  She reports that she had follow-up with PCP yesterday after recent COVID-19 diagnosis but was not having symptoms at that time.  She denies shortness of breath, nausea, vomiting, vision changes, lightheadedness, dizziness, syncope, leg pain or leg swelling, or other acute complaints. She denies recent travel, recent surgery, history of DVT/PE. She denies personal or family history of CAD. She states she was previously on daily anti-anxiety medication but has not been on this for several years. She denies SI, HI, or AVH.     Home Medications Prior to Admission medications   Medication Sig Start Date End Date Taking? Authorizing Provider  hydrOXYzine (ATARAX) 25 MG tablet Take 1 tablet (25 mg total) by mouth every 8 (eight) hours as needed for up to 3 days for anxiety. 10/07/22 10/10/22 Yes Annabelle Rexroad L, PA-C  amitriptyline (ELAVIL) 25 MG tablet Take 1 tablet by mouth at bedtime. 09/01/21   [provider]  Azelastine HCl 137 MCG/SPRAY SOLN SMARTSIG:1-2 Spray(s) Both Nares Twice Daily 12/11/21   [provider]   baclofen (LIORESAL) 10 MG tablet Baclofen 10 MG Oral Tablet QTY: 30 tablet Days: 30 Refills: 1  Written: 06/30/21 Patient Instructions: 1 every bedtime 06/30/21   [provider]  diclofenac Sodium (VOLTAREN) 1 % GEL Voltaren 1% External Gel QTY: 50 gram Days: 30 Refills: 2  Written: 06/30/21 Patient Instructions: apply bid to the area for 2 weks 06/30/21   [provider]  fluticasone (FLONASE) 50 MCG/ACT nasal spray Place into the nose. 10/17/21   [provider]  glipiZIDE (GLUCOTROL XL) 5 MG 24 hr tablet Take by mouth.    [provider]  losartan (COZAAR) 50 MG tablet Take 50 mg by mouth daily.    [provider]  pantoprazole (PROTONIX) 20 MG tablet Take by mouth.    [provider]  rizatriptan (MAXALT) 10 MG tablet Take by mouth. 01/14/22   [provider]      Allergies    Patient has no known allergies.    Review of Systems   Review of Systems  Constitutional:  Negative for activity change, appetite change, chills, fatigue and fever.  HENT:  Negative for ear pain, rhinorrhea and sore throat.   Eyes:  Negative for photophobia, pain and visual disturbance.  Respiratory:  Negative for cough, shortness of breath and wheezing.   Cardiovascular:  Positive for chest pain. Negative for palpitations and leg swelling.  Gastrointestinal:  Negative for abdominal pain, diarrhea, nausea and vomiting.  Genitourinary:  Negative for difficulty urinating, dysuria and hematuria.  Musculoskeletal:  Negative for arthralgias, back pain, neck  pain and neck stiffness.  Skin:  Negative for color change and rash.  Neurological:  Positive for headaches. Negative for dizziness, seizures, syncope, speech difficulty, weakness, light-headedness and numbness.  Psychiatric/Behavioral:  Negative for confusion.   All other systems reviewed and are negative.   Physical Exam Updated Vital Signs BP 107/74 (BP Location: Right Arm)   Pulse 68   Temp  97.7 F (36.5 C) (Oral)   Resp 18   SpO2 100%  Physical Exam Vitals and nursing note reviewed.  Constitutional:      General: She is not in acute distress.    Appearance: Normal appearance. She is not ill-appearing, toxic-appearing or diaphoretic.  HENT:     Head: Normocephalic and atraumatic.     Mouth/Throat:     Mouth: Mucous membranes are moist.  Eyes:     Extraocular Movements: Extraocular movements intact.     Conjunctiva/sclera: Conjunctivae normal.  Cardiovascular:     Rate and Rhythm: Normal rate and regular rhythm.     Heart sounds: Normal heart sounds. No murmur heard.    No S3 or S4 sounds.  Pulmonary:     Effort: Pulmonary effort is normal. No tachypnea or respiratory distress.     Breath sounds: Normal breath sounds. No decreased breath sounds, wheezing, rhonchi or rales.  Chest:     Chest wall: No mass, deformity, tenderness, crepitus or edema.  Abdominal:     General: Abdomen is flat.     Palpations: Abdomen is soft.     Tenderness: There is no abdominal tenderness. There is no guarding or rebound.  Musculoskeletal:        General: Normal range of motion.     Cervical back: Neck supple.     Right lower leg: No tenderness. No edema.     Left lower leg: No tenderness. No edema.  Skin:    General: Skin is warm and dry.     Capillary Refill: Capillary refill takes less than 2 seconds.  Neurological:     General: No focal deficit present.     Mental Status: She is alert and oriented to person, place, and time. Mental status is at baseline.     Cranial Nerves: No cranial nerve deficit.     Motor: No weakness.  Psychiatric:        Mood and Affect: Mood is anxious.        Speech: Speech normal.        Behavior: Behavior normal.        Thought Content: Thought content is not paranoid or delusional. Thought content does not include homicidal or suicidal ideation. Thought content does not include homicidal or suicidal plan.     ED Results / Procedures /  Treatments   Labs (all labs ordered are listed, but only abnormal results are displayed) Labs Reviewed  BASIC METABOLIC PANEL - Abnormal; Notable for the following components:      Result Value   Glucose, Bld 120 (*)    Creatinine, Ser 1.13 (*)    GFR, Estimated 58 (*)    All other components within normal limits  CBC  I-STAT BETA HCG BLOOD, ED ([facility], [facility], [facility] ONLY)  TROPONIN I (HIGH SENSITIVITY)  TROPONIN I (HIGH SENSITIVITY)    EKG NSR at 89bpm, nonspecific T wave abnormalities, similar to previous, no STEMI  Radiology DG Chest 2 View  Result Date: 10/07/2022 CLINICAL DATA:  Chest pain. EXAM: CHEST - 2 VIEW COMPARISON:  None Available. FINDINGS: Normal heart size and mediastinal  contours. No pleural effusion or edema. No airspace opacities identified. Visualized osseous structures are unremarkable. IMPRESSION: No active cardiopulmonary disease. Electronically Signed   By: Signa Kell M.D.   On: 10/07/2022 10:54    Procedures Procedures    Medications Ordered in ED Medications  ketorolac (TORADOL) 30 MG/ML injection 30 mg (30 mg Intramuscular Given 10/07/22 1327)  metoCLOPramide (REGLAN) injection 10 mg (10 mg Intramuscular Given 10/07/22 1326)    ED Course/ Medical Decision Making/ A&P                             Medical Decision Making Amount and/or Complexity of Data Reviewed Labs: ordered. Decision-making details documented in ED Course. Radiology: ordered. Decision-making details documented in ED Course. ECG/medicine tests: ordered. Decision-making details documented in ED Course.  Risk Prescription drug management.  HEART Score for Major Cardiac Events from [employer]co[employer]  on 10/07/2022 ** All calculations should be rechecked by clinician prior to use **  RESULT SUMMARY: 2 points Low Score (0-3 points)  Risk of MACE of 0.9-1.7%.   INPUTS: History --> 0 = Slightly suspicious EKG --> 0 = Normal Age --> 1 = 45-64 Risk factors --> 1 = 1-2 risk factors Initial  troponin --> 0 = ?normal limit  Wells' Criteria for Pulmonary Embolism from [email or web address]  on 10/07/2022 ** All calculations should be rechecked by clinician prior to use **  RESULT SUMMARY: 0.0 points Low risk group: 1.3% chance of PE in an ED population.   Another study assigned scores ? 4 as "PE Unlikely" and had a 3% incidence of PE.   INPUTS: Clinical signs and symptoms of DVT --> 0 = No PE is #1 diagnosis OR equally likely --> 0 = No Heart rate > 100 --> 0 = No Immobilization at least 3 days OR surgery in the previous 4 weeks --> 0 = No Previous, objectively diagnosed PE or DVT --> 0 = No Hemoptysis --> 0 = No Malignancy w/ treatment within 6 months or palliative --> 36 = No   52 year old female presenting to ED with complaint of acute chest pain. Differential diagnosis includes but is not limited to ACS, AAS, PE, Mallory-Weiss, Boerhaave's, Pneumonia, acute bronchitis, asthma or COPD exacerbation, anxiety, MSK pain or traumatic injury to the chest, acid reflux versus other. No respiratory symptoms, afebrile so do not suspect acute infectious process. No trauma to chest. Exam is unremarkable apart from significant anxiety regarding symptoms so highly suspicious this is an acute worsening of pt's previous history of generalized anxiety disorder which is currently untreated. Of note, she has also been off migraine preventatives and c/o headache today so given medications for treatment of this. EKG unremarkable, troponin negative x 2, chest x-ray negative, kidney function appears consistent with recent baseline and remainder of workup unremarkable. Pt noted significant improvement on re-examination with desire to be discharged home. Currently stable with improvement in symptoms and pt agreeable to close outpatient PCP follow up so I believe this is reasonable. Will discharge home with Atarax to take as needed for anxiety until PCP follow up, strict ED return precautions, and instruction to follow  up with PCP for re-evaluation early next week. Pt expressed understanding and in agreement with treatment plan. Stable for discharge.          Final Clinical Impression(s) / ED Diagnoses Final diagnoses:  Atypical chest pain  Anxiety    Rx / DC Orders ED Discharge Orders  Ordered    hydrOXYzine (ATARAX) 25 MG tablet  Every 8 hours PRN        10/07/22 1439              Suzzette Righter, PA-C 89/38/10 1751    Lianne Cure, DO 02/58/52 [phone]

## 2022-12-26 ENCOUNTER — Other Ambulatory Visit: Payer: No Typology Code available for payment source

## 2022-12-26 DIAGNOSIS — E1165 Type 2 diabetes mellitus with hyperglycemia: Secondary | ICD-10-CM

## 2022-12-26 DIAGNOSIS — E782 Mixed hyperlipidemia: Secondary | ICD-10-CM

## 2022-12-27 ENCOUNTER — Ambulatory Visit: Payer: No Typology Code available for payment source | Admitting: Family

## 2022-12-27 ENCOUNTER — Encounter: Payer: Self-pay | Admitting: Family

## 2022-12-27 VITALS — BP 122/84 | HR 86 | Ht 64.0 in | Wt 212.8 lb

## 2022-12-27 DIAGNOSIS — R1013 Epigastric pain: Secondary | ICD-10-CM

## 2022-12-27 LAB — CMP14+EGFR
ALT: 19 IU/L (ref 0–32)
AST: 19 IU/L (ref 0–40)
Albumin/Globulin Ratio: 1.7 (ref 1.2–2.2)
Albumin: 4.5 g/dL (ref 3.8–4.9)
Alkaline Phosphatase: 99 IU/L (ref 44–121)
BUN/Creatinine Ratio: 12 (ref 9–23)
BUN: 14 mg/dL (ref 6–24)
Bilirubin Total: 0.5 mg/dL (ref 0.0–1.2)
CO2: 23 mmol/L (ref 20–29)
Calcium: 10 mg/dL (ref 8.7–10.2)
Chloride: 107 mmol/L — ABNORMAL HIGH (ref 96–106)
Creatinine, Ser: 1.21 mg/dL — ABNORMAL HIGH (ref 0.57–1.00)
Globulin, Total: 2.7 g/dL (ref 1.5–4.5)
Glucose: 114 mg/dL — ABNORMAL HIGH (ref 70–99)
Potassium: 4.3 mmol/L (ref 3.5–5.2)
Sodium: 144 mmol/L (ref 134–144)
Total Protein: 7.2 g/dL (ref 6.0–8.5)
eGFR: 54 mL/min/{1.73_m2} — ABNORMAL LOW (ref >59)

## 2022-12-27 LAB — HEMOGLOBIN A1C
Est. average glucose Bld gHb Est-mCnc: 131 mg/dL
Hgb A1c MFr Bld: 6.2 % — ABNORMAL HIGH (ref 4.8–5.6)

## 2022-12-27 LAB — LIPID PANEL
Chol/HDL Ratio: 2.8 ratio (ref 0.0–4.4)
Cholesterol, Total: 145 mg/dL (ref 100–199)
HDL: 51 mg/dL (ref >39)
LDL Chol Calc (NIH): 74 mg/dL (ref 0–99)
Triglycerides: 112 mg/dL (ref 0–149)
VLDL Cholesterol Cal: 20 mg/dL (ref 5–40)

## 2022-12-27 NOTE — Progress Notes (Signed)
Established Patient Office Visit  Subjective:  Patient ID: Penny Myers, female    DOB: 12-25-1970  Age: 52 y.o. MRN: [id number]  Chief Complaint  Patient presents with   Acute Visit    Knot in abdomen/chest area    Patient is here with issues in her abdomen.  She has a "knot" in her epigastric area that has been quite painful and is bothering her significantly.   She was going to try and wait until her upcoming appointment with Dr. Welton Flakes, but she says it has been worsening.   No other concerns at this time.   Past Medical History:  Diagnosis Date   Acid reflux    Anxiety    Atypical chest pain 07/02/2015   Note: Unchanged Note: Unchanged   Family history of malignant neoplasm 09/28/2015   Note: Unchanged   Heart murmur    Hypertension    States that she does not have HBP, but takes losartan for leaking valve.   Intussusception of intestine     Past Surgical History:  Procedure Laterality Date   APPENDECTOMY     CHOLECYSTECTOMY     SHOULDER ARTHROSCOPY WITH SUBACROMIAL DECOMPRESSION Left 12/09/2015   Procedure: SHOULDER ARTHROSCOPY WITH SUBACROMIAL DECOMPRESSION;  Surgeon: Loreta Ave, MD;  Location: [facility];  Service: Orthopedics;  Laterality: Left;   TUBAL LIGATION      Social History   Socioeconomic History   Marital status: Married    Spouse name: Not on file   Number of children: Not on file   Years of education: Not on file   Highest education level: Not on file  Occupational History   Not on file  Tobacco Use   Smoking status: Every Day    Packs/day: .5    Types: Cigarettes   Smokeless tobacco: Never  Vaping Use   Vaping Use: Never used  Substance and Sexual Activity   Alcohol use: Yes    Comment: occ   Drug use: Yes    Types: Marijuana   Sexual activity: Not on file  Other Topics Concern   Not on file  Social History Narrative   Not on file   Social Determinants of Health   Financial Resource Strain: Not on file   Food Insecurity: Not on file  Transportation Needs: Not on file  Physical Activity: Not on file  Stress: Not on file  Social Connections: Not on file  Intimate Partner Violence: Not on file    Family History  Problem Relation Age of Onset   Cancer Maternal Aunt    Breast cancer Maternal Aunt        40's   Gastric cancer Maternal Grandmother     No Known Allergies  Review of Systems  Gastrointestinal:  Positive for abdominal pain. Constipation: epigastric region.      Lump in epigastric area.   All other systems reviewed and are negative.      Objective:   BP 122/84   Pulse 86   Ht 5\' 4"  (1.626 m)   Wt 212 lb 12.8 oz (96.5 kg)   SpO2 99%   BMI 36.53 kg/m   Vitals:   12/27/22 1117  BP: 122/84  Pulse: 86  Height: 5\' 4"  (1.626 m)  Weight: 212 lb 12.8 oz (96.5 kg)  SpO2: 99%  BMI (Calculated): 36.51    Physical Exam Vitals and nursing note reviewed.  Constitutional:      Appearance: Normal appearance. She is normal weight.  HENT:  Head: Normocephalic.  Eyes:     Pupils: Pupils are equal, round, and reactive to light.  Cardiovascular:     Rate and Rhythm: Normal rate.  Pulmonary:     Effort: Pulmonary effort is normal.  Abdominal:     Tenderness: There is abdominal tenderness.     Hernia: A hernia is present.  Neurological:     Mental Status: She is alert.      No results found for any visits on 12/27/22.   Assessment & Plan:   Problem List Items Addressed This Visit   None Visit Diagnoses     Epigastric discomfort    -  Primary   Relevant Orders   US Abdomen Limited       Return tbd based on results of [place].   Total time spent: 20 minutes  Miki Kins, FNP  12/27/2022

## 2022-12-30 ENCOUNTER — Other Ambulatory Visit: Payer: Self-pay | Admitting: Family

## 2023-01-05 ENCOUNTER — Ambulatory Visit: Payer: No Typology Code available for payment source | Admitting: Internal Medicine

## 2023-01-08 ENCOUNTER — Ambulatory Visit: Payer: No Typology Code available for payment source | Admitting: Internal Medicine

## 2023-01-27 ENCOUNTER — Other Ambulatory Visit: Payer: Self-pay | Admitting: Internal Medicine

## 2023-02-13 ENCOUNTER — Ambulatory Visit: Payer: No Typology Code available for payment source | Admitting: Internal Medicine

## 2023-03-08 ENCOUNTER — Encounter: Payer: Self-pay | Admitting: Internal Medicine

## 2023-03-08 ENCOUNTER — Ambulatory Visit (INDEPENDENT_AMBULATORY_CARE_PROVIDER_SITE_OTHER): Payer: Managed Care, Other (non HMO) | Admitting: Internal Medicine

## 2023-03-08 VITALS — BP 122/84 | HR 76 | Ht 64.0 in | Wt 208.2 lb

## 2023-03-08 DIAGNOSIS — R1013 Epigastric pain: Secondary | ICD-10-CM

## 2023-03-08 DIAGNOSIS — I10 Essential (primary) hypertension: Secondary | ICD-10-CM

## 2023-03-08 DIAGNOSIS — E1169 Type 2 diabetes mellitus with other specified complication: Secondary | ICD-10-CM

## 2023-03-08 DIAGNOSIS — F17200 Nicotine dependence, unspecified, uncomplicated: Secondary | ICD-10-CM

## 2023-03-08 DIAGNOSIS — G4733 Obstructive sleep apnea (adult) (pediatric): Secondary | ICD-10-CM | POA: Insufficient documentation

## 2023-03-08 DIAGNOSIS — E119 Type 2 diabetes mellitus without complications: Secondary | ICD-10-CM | POA: Insufficient documentation

## 2023-03-08 DIAGNOSIS — K219 Gastro-esophageal reflux disease without esophagitis: Secondary | ICD-10-CM

## 2023-03-08 LAB — POCT CBG (FASTING - GLUCOSE)-MANUAL ENTRY: Glucose Fasting, POC: 98 mg/dL (ref 70–99)

## 2023-03-08 NOTE — Progress Notes (Signed)
Established Patient Office Visit  Subjective:  Patient ID: Penny Myers, female    DOB: Jan 09, 1971  Age: 52 y.o. MRN: [id number]  Chief Complaint  Patient presents with   Follow-up    Follow up    Patient comes in for a follow-up of mid epigastric pain .  Patient has been experiencing this for some time and has been on a PPI.  The pain and tenderness also involves the lower part of her sternum and was advised to use Voltaren gel at that spot.  She recently went to the emergency room where a CT scan of the abdomen was done and this was found to be negative.  Carafate was added which she is also taking currently.  Recommendation was made that she should see her GI and have an EGD done.  Patient has previously done EGDs which were unremarkable.  However we will set up a GI referral.  Feels like food is getting stuck in her throat.  Advised to continue taking Protonix and Carafate She also has symptoms suggestive of obstructive sleep apnea.  A home sleep test was scheduled but she would rather have it done at a sleep lab.  Will send in a new referral for that. Mammogram 04/2022. Colonoscopy 10/2020.     No other concerns at this time.   Past Medical History:  Diagnosis Date   Acid reflux    Anxiety    Atypical chest pain 07/02/2015   Note: Unchanged Note: Unchanged   Family history of malignant neoplasm 09/28/2015   Note: Unchanged   Heart murmur    Hypertension    States that she does not have HBP, but takes losartan for leaking valve.   Intussusception of intestine [facility])     Past Surgical History:  Procedure Laterality Date   APPENDECTOMY     CHOLECYSTECTOMY     SHOULDER ARTHROSCOPY WITH SUBACROMIAL DECOMPRESSION Left 12/09/2015   Procedure: SHOULDER ARTHROSCOPY WITH SUBACROMIAL DECOMPRESSION;  Surgeon: Loreta Ave, MD;  Location: [facility];  Service: Orthopedics;  Laterality: Left;   TUBAL LIGATION      Social History   Socioeconomic History   Marital  status: Married    Spouse name: Not on file   Number of children: Not on file   Years of education: Not on file   Highest education level: Not on file  Occupational History   Not on file  Tobacco Use   Smoking status: Every Day    Packs/day: .5    Types: Cigarettes   Smokeless tobacco: Never  Vaping Use   Vaping Use: Never used  Substance and Sexual Activity   Alcohol use: Yes    Comment: occ   Drug use: Yes    Types: Marijuana   Sexual activity: Not on file  Other Topics Concern   Not on file  Social History Narrative   Not on file   Social Determinants of Health   Financial Resource Strain: Not on file  Food Insecurity: Not on file  Transportation Needs: Not on file  Physical Activity: Not on file  Stress: Not on file  Social Connections: Not on file  Intimate Partner Violence: Not on file    Family History  Problem Relation Age of Onset   Cancer Maternal Aunt    Breast cancer Maternal Aunt        40's   Gastric cancer Maternal Grandmother     No Known Allergies  Review of Systems  Constitutional: Negative.  Negative for chills, fever, malaise/fatigue and weight loss.  HENT: Negative.    Eyes: Negative.   Respiratory:  Negative for cough, shortness of breath and wheezing.   Cardiovascular: Negative.  Negative for chest pain, palpitations, leg swelling and PND.  Gastrointestinal:  Positive for heartburn and nausea. Negative for abdominal pain, blood in stool, constipation, diarrhea, melena and vomiting.  Genitourinary: Negative.  Negative for dysuria and urgency.  Musculoskeletal: Negative.  Negative for back pain, falls, joint pain and myalgias.  Skin: Negative.   Neurological:  Negative for dizziness, tingling, seizures, weakness and headaches.  Endo/Heme/Allergies: Negative.   Psychiatric/Behavioral: Negative.  Negative for depression.        Objective:   BP 122/84   Pulse 76   Ht 5\' 4"  (1.626 m)   Wt 208 lb 3.2 oz (94.4 kg)   SpO2 98%   BMI  35.74 kg/m   Vitals:   03/08/23 1443  BP: 122/84  Pulse: 76  Height: 5\' 4"  (1.626 m)  Weight: 208 lb 3.2 oz (94.4 kg)  SpO2: 98%  BMI (Calculated): 35.72    Physical Exam Vitals and nursing note reviewed.  Constitutional:      Appearance: Normal appearance.  HENT:     Head: Normocephalic and atraumatic.     Nose: Nose normal.  Cardiovascular:     Rate and Rhythm: Normal rate and regular rhythm.     Pulses: Normal pulses.     Heart sounds: Normal heart sounds. No murmur heard. Pulmonary:     Effort: Pulmonary effort is normal.     Breath sounds: Normal breath sounds. No rales.  Chest:     Chest wall: No tenderness.  Abdominal:     General: There is no distension.     Palpations: Abdomen is soft. There is no mass.     Tenderness: There is no abdominal tenderness. There is no right CVA tenderness, left CVA tenderness, guarding or rebound.     Hernia: No hernia is present.  Musculoskeletal:        General: Normal range of motion.     Cervical back: Normal range of motion.     Right lower leg: No edema.     Left lower leg: No edema.  Skin:    General: Skin is warm and dry.  Neurological:     General: No focal deficit present.     Mental Status: She is alert and oriented to person, place, and time.  Psychiatric:        Mood and Affect: Mood normal.        Behavior: Behavior normal.      Results for orders placed or performed in visit on 03/08/23  POCT CBG (Fasting - Glucose)  Result Value Ref Range   Glucose Fasting, POC 98 70 - 99 mg/dL    Recent Results (from the past 2160 hour(s))  CMP14+EGFR     Status: Abnormal   Collection Time: 12/26/22  9:22 AM  Result Value Ref Range   Glucose 114 (H) 70 - 99 mg/dL   BUN 14 6 - 24 mg/dL   Creatinine, Ser 8.46 (H) 0.57 - 1.00 mg/dL   eGFR 54 (L) >96 EX/BMW/4.13   BUN/Creatinine Ratio 12 9 - 23   Sodium 144 134 - 144 mmol/L   Potassium 4.3 3.5 - 5.2 mmol/L   Chloride 107 (H) 96 - 106 mmol/L   CO2 23 20 - 29 mmol/L    Calcium 10.0 8.7 - 10.2 mg/dL   Total Protein  7.2 6.0 - 8.5 g/dL   Albumin 4.5 3.8 - 4.9 g/dL   Globulin, Total 2.7 1.5 - 4.5 g/dL   Albumin/Globulin Ratio 1.7 1.2 - 2.2   Bilirubin Total 0.5 0.0 - 1.2 mg/dL   Alkaline Phosphatase 99 44 - 121 IU/L   AST 19 0 - 40 IU/L   ALT 19 0 - 32 IU/L  Hemoglobin A1c     Status: Abnormal   Collection Time: 12/26/22  9:22 AM  Result Value Ref Range   Hgb A1c MFr Bld 6.2 (H) 4.8 - 5.6 %    Comment:          Prediabetes: 5.7 - 6.4          Diabetes: >6.4          Glycemic control for adults with diabetes: <7.0    Est. average glucose Bld gHb Est-mCnc 131 mg/dL  Lipid panel     Status: None   Collection Time: 12/26/22  9:22 AM  Result Value Ref Range   Cholesterol, Total 145 100 - 199 mg/dL   Triglycerides 161 0 - 149 mg/dL   HDL 51 >09 mg/dL   VLDL Cholesterol Cal 20 5 - 40 mg/dL   LDL Chol Calc (NIH) 74 0 - 99 mg/dL   Chol/HDL Ratio 2.8 0.0 - 4.4 ratio    Comment:                                   T. Chol/HDL Ratio                                             Men  Women                               1/2 Avg.Risk  3.4    3.3                                   Avg.Risk  5.0    4.4                                2X Avg.Risk  9.6    7.1                                3X Avg.Risk 23.4   11.0   POCT CBG (Fasting - Glucose)     Status: None   Collection Time: 03/08/23  2:49 PM  Result Value Ref Range   Glucose Fasting, POC 98 70 - 99 mg/dL      Assessment & Plan:  Set up referral for the GI.  May need an EGD.  Meanwhile continue all medications.  Schedule sleep study.  Will check labs at the next follow-up. Problem List Items Addressed This Visit     Current smoker   Esophageal reflux   Relevant Medications   sucralfate (CARAFATE) 1 GM/10ML suspension   Essential hypertension   Diabetes mellitus (HCC) - Primary   Relevant Orders   POCT CBG (Fasting - Glucose) (Completed)   Epigastric pain  Relevant Orders   Ambulatory referral to  Gastroenterology   OSA (obstructive sleep apnea)   Relevant Orders   Ambulatory referral to Sleep Studies    Return in about 2 months (around 05/08/2023).   Total time spent: 30 minutes  Margaretann Loveless, MD  03/08/2023   This document may have been prepared by [facility] Voice Recognition software and as such may include unintentional dictation errors.

## 2023-03-16 ENCOUNTER — Other Ambulatory Visit: Payer: Self-pay | Admitting: Family

## 2023-04-24 ENCOUNTER — Ambulatory Visit: Payer: Managed Care, Other (non HMO) | Admitting: Family

## 2023-05-11 ENCOUNTER — Ambulatory Visit
Admission: RE | Admit: 2023-05-11 | Discharge: 2023-05-11 | Disposition: A | Discharge status: Home / Self Care | Payer: Managed Care, Other (non HMO) | Attending: Internal Medicine | Admitting: Internal Medicine

## 2023-05-11 ENCOUNTER — Ambulatory Visit
Admission: RE | Admit: 2023-05-11 | Discharge: 2023-05-11 | Disposition: A | Discharge status: Home / Self Care | Payer: Managed Care, Other (non HMO) | Source: Ambulatory Visit | Attending: Internal Medicine | Admitting: Internal Medicine

## 2023-05-11 ENCOUNTER — Ambulatory Visit: Payer: Managed Care, Other (non HMO) | Admitting: Internal Medicine

## 2023-05-11 ENCOUNTER — Encounter: Payer: Self-pay | Admitting: Internal Medicine

## 2023-05-11 VITALS — BP 146/78 | HR 79 | Ht 64.5 in | Wt 211.6 lb

## 2023-05-11 DIAGNOSIS — Z1231 Encounter for screening mammogram for malignant neoplasm of breast: Secondary | ICD-10-CM

## 2023-05-11 DIAGNOSIS — E1169 Type 2 diabetes mellitus with other specified complication: Secondary | ICD-10-CM

## 2023-05-11 DIAGNOSIS — R0789 Other chest pain: Secondary | ICD-10-CM | POA: Diagnosis not present

## 2023-05-11 DIAGNOSIS — E782 Mixed hyperlipidemia: Secondary | ICD-10-CM

## 2023-05-11 DIAGNOSIS — F411 Generalized anxiety disorder: Secondary | ICD-10-CM

## 2023-05-11 DIAGNOSIS — I10 Essential (primary) hypertension: Secondary | ICD-10-CM | POA: Diagnosis not present

## 2023-05-11 LAB — POCT CBG (FASTING - GLUCOSE)-MANUAL ENTRY: Glucose Fasting, POC: 82 mg/dL (ref 70–99)

## 2023-05-11 MED ORDER — CITALOPRAM HYDROBROMIDE 10 MG PO TABS: 10.0000 mg | ORAL_TABLET | Freq: Every day | ORAL | 2 refills | Status: DC

## 2023-05-11 MED ORDER — CELECOXIB 200 MG PO CAPS: 200.0000 mg | ORAL_CAPSULE | Freq: Every day | ORAL | 2 refills | Status: DC

## 2023-05-11 NOTE — Progress Notes (Signed)
Established Patient Office Visit  Subjective:  Patient ID: Penny Myers, female    DOB: December 09, 1970  Age: 52 y.o. MRN: [id number]  Chief Complaint  Patient presents with   Follow-up    2 month follow up    Patient comes in for her follow-up today.  She complains of right-sided chest wall pain started few days ago.  She has a prescription for baclofen but it is not helping.  There is no cough, chest congestion, no fevers or chills.  There is no rash or burning pain over that area.  Patient thinks that she must of pulled a muscle.  Will send in a prescription for Celebrex to be taken along with baclofen.  However we will also take a chest x-ray.  Her blood pressure is a little high today, she is fasting for blood work. Needs to be scheduled for mammogram, order sent. Patient is currently seeing a psychologist for anxiety.  She would like to try a small dose of citalopram.  Will send in 10 mg/day.    No other concerns at this time.   Past Medical History:  Diagnosis Date   Acid reflux    Anxiety    Atypical chest pain 07/02/2015   Note: Unchanged Note: Unchanged   Family history of malignant neoplasm 09/28/2015   Note: Unchanged   Heart murmur    Hypertension    States that she does not have HBP, but takes losartan for leaking valve.   Intussusception of intestine [facility])     Past Surgical History:  Procedure Laterality Date   APPENDECTOMY     CHOLECYSTECTOMY     SHOULDER ARTHROSCOPY WITH SUBACROMIAL DECOMPRESSION Left 12/09/2015   Procedure: SHOULDER ARTHROSCOPY WITH SUBACROMIAL DECOMPRESSION;  Surgeon: Loreta Ave, MD;  Location: [facility];  Service: Orthopedics;  Laterality: Left;   TUBAL LIGATION      Social History   Socioeconomic History   Marital status: Married    Spouse name: Not on file   Number of children: Not on file   Years of education: Not on file   Highest education level: Not on file  Occupational History   Not on file  Tobacco  Use   Smoking status: Every Day    Current packs/day: 0.50    Types: Cigarettes   Smokeless tobacco: Never  Vaping Use   Vaping status: Never Used  Substance and Sexual Activity   Alcohol use: Yes    Comment: occ   Drug use: Yes    Types: Marijuana   Sexual activity: Not on file  Other Topics Concern   Not on file  Social History Narrative   Not on file   Social Determinants of Health   Financial Resource Strain: Not on file  Food Insecurity: Not on file  Transportation Needs: Not on file  Physical Activity: Not on file  Stress: Not on file  Social Connections: Not on file  Intimate Partner Violence: Not on file    Family History  Problem Relation Age of Onset   Cancer Maternal Aunt    Breast cancer Maternal Aunt        40's   Gastric cancer Maternal Grandmother     No Known Allergies  Review of Systems  Constitutional: Negative.  Negative for chills, diaphoresis, fever, malaise/fatigue and weight loss.  HENT: Negative.    Eyes: Negative.   Respiratory: Negative.  Negative for cough and shortness of breath.   Cardiovascular: Negative.  Negative for chest pain (  right chest wall pain), palpitations and leg swelling.  Gastrointestinal: Negative.  Negative for abdominal pain, constipation, diarrhea, heartburn, nausea and vomiting.  Genitourinary: Negative.  Negative for dysuria and flank pain.  Musculoskeletal: Negative.  Negative for joint pain and myalgias.  Skin: Negative.   Neurological: Negative.  Negative for dizziness and headaches.  Endo/Heme/Allergies: Negative.   Psychiatric/Behavioral: Negative.  Negative for depression and suicidal ideas. The patient is not nervous/anxious.        Objective:   BP (!) 146/78   Pulse 79   Ht 5' 4.5" (1.638 m)   Wt 211 lb 9.6 oz (96 kg)   SpO2 99%   BMI 35.76 kg/m   Vitals:   05/11/23 1431  BP: (!) 146/78  Pulse: 79  Height: 5' 4.5" (1.638 m)  Weight: 211 lb 9.6 oz (96 kg)  SpO2: 99%  BMI (Calculated):  35.77    Physical Exam Vitals and nursing note reviewed.  Constitutional:      Appearance: Normal appearance.  HENT:     Head: Normocephalic and atraumatic.     Nose: Nose normal.     Mouth/Throat:     Mouth: Mucous membranes are moist.     Pharynx: Oropharynx is clear.  Eyes:     Conjunctiva/sclera: Conjunctivae normal.     Pupils: Pupils are equal, round, and reactive to light.  Cardiovascular:     Rate and Rhythm: Normal rate and regular rhythm.     Pulses: Normal pulses.     Heart sounds: Normal heart sounds. No murmur heard. Pulmonary:     Effort: Pulmonary effort is normal.     Breath sounds: Normal breath sounds. No wheezing.  Abdominal:     General: Bowel sounds are normal.     Palpations: Abdomen is soft.     Tenderness: There is no abdominal tenderness. There is no right CVA tenderness or left CVA tenderness.  Musculoskeletal:        General: Normal range of motion.     Cervical back: Normal range of motion.     Right lower leg: No edema.     Left lower leg: No edema.  Skin:    General: Skin is warm and dry.  Neurological:     General: No focal deficit present.     Mental Status: She is alert and oriented to person, place, and time.  Psychiatric:        Mood and Affect: Mood normal.        Behavior: Behavior normal.      Results for orders placed or performed in visit on 05/11/23  POCT CBG (Fasting - Glucose)  Result Value Ref Range   Glucose Fasting, POC 82 70 - 99 mg/dL    Recent Results (from the past 2160 hour(s))  POCT CBG (Fasting - Glucose)     Status: None   Collection Time: 03/08/23  2:49 PM  Result Value Ref Range   Glucose Fasting, POC 98 70 - 99 mg/dL  POCT CBG (Fasting - Glucose)     Status: None   Collection Time: 05/11/23  2:36 PM  Result Value Ref Range   Glucose Fasting, POC 82 70 - 99 mg/dL      Assessment & Plan:  Chest x-ray today. Start Celebrex and baclofen. Labs.  Monitor blood pressure. Start small dose citalopram for  anxiety. Problem List Items Addressed This Visit     Diabetes mellitus (HCC) - Primary   Relevant Orders   POCT CBG (Fasting - Glucose) (  Completed)   Hemoglobin A1c   Other Visit Diagnoses     Right-sided chest wall pain       Relevant Medications   celecoxib (CELEBREX) 200 MG capsule   Other Relevant Orders   DG Chest 2 View   CBC With Differential   Breast cancer screening by mammogram       Relevant Orders   MM 3D SCREENING MAMMOGRAM BILATERAL BREAST   GAD (generalized anxiety disorder)       Relevant Medications   citalopram (CELEXA) 10 MG tablet   Mixed hyperlipidemia       Relevant Orders   Lipid Panel w/o Chol/HDL Ratio   Essential hypertension, benign       Relevant Orders   CMP14+EGFR       Follow up 2 months.  Total time spent: 30 minutes  Margaretann Loveless, MD  05/11/2023   This document may have been prepared by [facility] Voice Recognition software and as such may include unintentional dictation errors.

## 2023-05-12 LAB — LIPID PANEL W/O CHOL/HDL RATIO
Cholesterol, Total: 134 mg/dL (ref 100–199)
HDL: 53 mg/dL (ref >39)
LDL Chol Calc (NIH): 66 mg/dL (ref 0–99)
Triglycerides: 74 mg/dL (ref 0–149)
VLDL Cholesterol Cal: 15 mg/dL (ref 5–40)

## 2023-05-12 LAB — CMP14+EGFR
ALT: 16 IU/L (ref 0–32)
AST: 15 IU/L (ref 0–40)
Albumin: 4.4 g/dL (ref 3.8–4.9)
Alkaline Phosphatase: 90 IU/L (ref 44–121)
BUN/Creatinine Ratio: 9 (ref 9–23)
BUN: 10 mg/dL (ref 6–24)
Bilirubin Total: 0.3 mg/dL (ref 0.0–1.2)
CO2: 24 mmol/L (ref 20–29)
Calcium: 9.6 mg/dL (ref 8.7–10.2)
Chloride: 106 mmol/L (ref 96–106)
Creatinine, Ser: 1.06 mg/dL — ABNORMAL HIGH (ref 0.57–1.00)
Globulin, Total: 2.7 g/dL (ref 1.5–4.5)
Glucose: 90 mg/dL (ref 70–99)
Potassium: 4.5 mmol/L (ref 3.5–5.2)
Sodium: 145 mmol/L — ABNORMAL HIGH (ref 134–144)
Total Protein: 7.1 g/dL (ref 6.0–8.5)
eGFR: 63 mL/min/{1.73_m2} (ref >59)

## 2023-05-12 LAB — HEMOGLOBIN A1C
Est. average glucose Bld gHb Est-mCnc: 126 mg/dL
Hgb A1c MFr Bld: 6 % — ABNORMAL HIGH (ref 4.8–5.6)

## 2023-05-14 NOTE — Progress Notes (Signed)
Patient notified

## 2023-05-17 ENCOUNTER — Ambulatory Visit
Admission: RE | Admit: 2023-05-17 | Discharge: 2023-05-17 | Disposition: A | Discharge status: Home / Self Care | Payer: Self-pay | Source: Ambulatory Visit | Attending: Internal Medicine | Admitting: Internal Medicine

## 2023-05-17 DIAGNOSIS — Z1231 Encounter for screening mammogram for malignant neoplasm of breast: Secondary | ICD-10-CM

## 2023-05-17 NOTE — Progress Notes (Signed)
Spoke with pt who verbalized understanding.

## 2023-05-17 NOTE — Progress Notes (Signed)
Spoke to pt who verbalized understanding.  

## 2023-05-22 NOTE — Progress Notes (Signed)
To be discussed at follow up.

## 2023-05-30 ENCOUNTER — Telehealth: Payer: Self-pay

## 2023-05-30 NOTE — Telephone Encounter (Signed)
Patient has been having her colonoscopy with [facility]. We need to find out why she is wanting to transfer her care to our office and get Dr. Allegra Lai approval. Tried to call patient and had to leave a message for call back. Patient appointment is schedule for 06/05/2023

## 2023-05-30 NOTE — Telephone Encounter (Signed)
Pt moved to area having transportation problems

## 2023-05-30 NOTE — Telephone Encounter (Signed)
Left vm with pt waiting on call back

## 2023-06-05 ENCOUNTER — Encounter: Payer: Self-pay | Admitting: Gastroenterology

## 2023-06-05 ENCOUNTER — Telehealth: Payer: Self-pay

## 2023-06-05 ENCOUNTER — Ambulatory Visit (INDEPENDENT_AMBULATORY_CARE_PROVIDER_SITE_OTHER): Payer: Self-pay | Admitting: Gastroenterology

## 2023-06-05 VITALS — BP 132/87 | HR 93 | Temp 97.7°F | Ht 64.5 in | Wt 210.5 lb

## 2023-06-05 DIAGNOSIS — K219 Gastro-esophageal reflux disease without esophagitis: Secondary | ICD-10-CM

## 2023-06-05 DIAGNOSIS — R1906 Epigastric swelling, mass or lump: Secondary | ICD-10-CM

## 2023-06-05 DIAGNOSIS — R1013 Epigastric pain: Secondary | ICD-10-CM

## 2023-06-05 NOTE — Progress Notes (Unsigned)
Arlyss Repress, MD [address]  [address]  [place], [place] [address]  Main: [phone]  Fax: [phone]    Gastroenterology Consultation  Referring Provider:     Margaretann Loveless, MD Primary Care Physician:  Margaretann Loveless, MD Primary Gastroenterologist: [facility], GI Reason for Consultation: Chronic GERD, epigastric pain        HPI:   Penny Myers is a 52 y.o. female referred by Dr. Margaretann Loveless, MD  for consultation & management of chronic GERD, epigastric pain.  Patient has history of chronic GERD, maintained on Protonix 40 mg daily.  She has history of hiatal hernia, her main concern today is pain in the epigastric area, especially when she bends over, she feels like something is popping out associated with reflux.  She is s/p cholecystectomy.  She went to ER at [facility] end of May because of epigastric pain, underwent CT abdomen pelvis with contrast which did not reveal any acute intra-abdominal pathology, ultrasound right upper quadrant was unrevealing except for liver echogenicity.  Serum lipase, LFTs, CBC were normal at that time.  She denies any abdominal bloating.  She does report difficulty moving her bowels, tried Metamucil which she felt everything was blocked.  She admits to not drinking adequate amount of water, not incorporating good amount of natural fiber in her daily meals.  She also identifies triggers such as spaghetti, pizza, tomato sauce, coffee.  She has to drink coffee in the morning.  NSAIDs: None  Antiplts/Anticoagulants/Anti thrombotics: None  GI Procedures:  Colonoscopy 11/08/2020 at [facility]   The terminal ileum appeared normal.       Scattered areas of nodular mucosa were found in the entire colon.       Biopsies were taken with a cold forceps for histology.       Scattered small-mouthed diverticula were found in the sigmoid colon.       The exam was otherwise without abnormality on direct and retroflexion       views. No masses or polyp appreciated.  A:  Colon, biopsy: -Multiple fragments of tubular adenoma and polypoid colonic mucosa with a mucosal lymphoid aggregate.   Past Medical History:  Diagnosis Date   Acid reflux    Anxiety    Atypical chest pain 07/02/2015   Note: Unchanged Note: Unchanged   Family history of malignant neoplasm 09/28/2015   Note: Unchanged   Heart murmur    Hypertension    States that she does not have HBP, but takes losartan for leaking valve.   Intussusception of intestine [facility])     Past Surgical History:  Procedure Laterality Date   APPENDECTOMY     CHOLECYSTECTOMY     SHOULDER ARTHROSCOPY WITH SUBACROMIAL DECOMPRESSION Left 12/09/2015   Procedure: SHOULDER ARTHROSCOPY WITH SUBACROMIAL DECOMPRESSION;  Surgeon: Loreta Ave, MD;  Location: [facility];  Service: Orthopedics;  Laterality: Left;   TUBAL LIGATION       Current Outpatient Medications:    ASHWAGANDHA GUMMIES PO, Take by mouth., Disp: , Rfl:    celecoxib (CELEBREX) 200 MG capsule, Take 1 capsule (200 mg total) by mouth daily., Disp: 30 capsule, Rfl: 2   cyclobenzaprine (FLEXERIL) 10 MG tablet, Take 10 mg by mouth 3 (three) times daily as needed., Disp: , Rfl:    diclofenac Sodium (VOLTAREN) 1 % GEL, Voltaren 1% External Gel QTY: 50 gram Days: 30 Refills: 2  Written: 06/30/21 Patient Instructions: apply bid to the area for 2 weks, Disp: ,  Rfl:    EMGALITY 120 MG/ML SOAJ, Inject into the skin., Disp: , Rfl:    fluticasone (FLONASE) 50 MCG/ACT nasal spray, Place into the nose., Disp: , Rfl:    glipiZIDE (GLUCOTROL XL) 2.5 MG 24 hr tablet, TAKE ONE TABLET BY MOUTH ONE TIME DAILY, Disp: 90 tablet, Rfl: 3   hydrochlorothiazide (MICROZIDE) 12.5 MG capsule, TAKE ONE CAPSULE BY MOUTH ONE TIME DAILY IF BLOOD PRESSURE IS ELEVATED, Disp: , Rfl:    loratadine (CLARITIN) 10 MG tablet, Take by mouth., Disp: , Rfl:    losartan (COZAAR) 100 MG tablet, TAKE ONE TABLET BY MOUTH ONE TIME DAILY, Disp: 90 tablet, Rfl: 1   SUMAtriptan (IMITREX)  100 MG tablet, Take 100 mg by mouth as directed., Disp: , Rfl:    venlafaxine (EFFEXOR) 37.5 MG tablet, Take 37.5 mg by mouth., Disp: , Rfl:    pantoprazole (PROTONIX) 40 MG tablet, Take 1 tablet (40 mg total) by mouth 2 (two) times daily before a meal., Disp: 180 tablet, Rfl: 0   Family History  Problem Relation Age of Onset   Cancer Maternal Aunt    Breast cancer Maternal Aunt        51's   Gastric cancer Maternal Grandmother      Social History   Tobacco Use   Smoking status: Every Day    Current packs/day: 0.50    Types: Cigarettes   Smokeless tobacco: Never  Vaping Use   Vaping status: Never Used  Substance Use Topics   Alcohol use: Yes    Comment: occ   Drug use: Yes    Types: Marijuana    Allergies as of 06/05/2023   (No Known Allergies)    Review of Systems:    All systems reviewed and negative except where noted in HPI.   Physical Exam:  BP 132/87 (BP Location: Left Arm, Patient Position: Sitting, Cuff Size: Large)   Pulse 93   Temp 97.7 F (36.5 C) (Oral)   Ht 5' 4.5" (1.638 m)   Wt 210 lb 8 oz (95.5 kg)   BMI 35.57 kg/m  No LMP recorded. Patient has had an ablation.  General:   Alert,  Well-developed, well-nourished, pleasant and cooperative in NAD Head:  Normocephalic and atraumatic. Eyes:  Sclera clear, no icterus.   Conjunctiva pink. Ears:  Normal auditory acuity. Nose:  No deformity, discharge, or lesions. Mouth:  No deformity or lesions,oropharynx pink & moist. Neck:  Supple; no masses or thyromegaly. Lungs:  Respirations even and unlabored.  Clear throughout to auscultation.   No wheezes, crackles, or rhonchi. No acute distress. Heart:  Regular rate and rhythm; no murmurs, clicks, rubs, or gallops. Abdomen:  Normal bowel sounds. Soft, non-tender and non-distended without masses, hepatosplenomegaly or hernias noted.  No guarding or rebound tenderness.   Rectal: Not performed Msk:  Symmetrical without gross deformities. Good, equal movement &  strength bilaterally. Pulses:  Normal pulses noted. Extremities:  No clubbing or edema.  No cyanosis. Neurologic:  Alert and oriented x3;  grossly normal neurologically. Skin:  Intact without significant lesions or rashes. No jaundice. Psych:  Alert and cooperative. Normal mood and affect.  Imaging Studies: Reviewed  Assessment and Plan:   Penny Myers is a 52 y.o. pleasant African-American female with BMI of 35.57, hypertension, chronic GERD is seen in consultation for worsening of GERD symptoms and epigastric swelling  Chronic GERD Increase Protonix to 40 mg p.o. twice daily before meals Reiterated on antireflux lifestyle Educated about healthy eating habits, incorporate  physical activity and exercise into daily routine  Epigastric swelling CT abdomen and pelvis in end of April 2024 was unrevealing Most likely hiatal hernia leading to intermittent swelling Discussed about upper endoscopy if her symptoms are persistent in next 2 to 3 months    Follow up in 3 months with Lavonna Monarch, MD

## 2023-06-05 NOTE — Patient Instructions (Signed)
Increase the Pantoprazole 40mg  to twice a day.

## 2023-06-05 NOTE — Telephone Encounter (Signed)
Per pt will need a refill on Protonix. Md has ask her to increase dosage. Pt would like new [facility]  [facility]

## 2023-06-06 ENCOUNTER — Encounter: Payer: Self-pay | Admitting: Gastroenterology

## 2023-06-06 MED ORDER — PANTOPRAZOLE SODIUM 40 MG PO TBEC: 40.0000 mg | DELAYED_RELEASE_TABLET | Freq: Two times a day (BID) | ORAL | 0 refills | Status: DC

## 2023-06-06 NOTE — Telephone Encounter (Signed)
Sent medication to the pharmacy

## 2023-06-06 NOTE — Addendum Note (Signed)
Addended by: Radene Knee L on: 06/06/2023 07:42 AM   Modules accepted: Orders

## 2023-06-11 ENCOUNTER — Ambulatory Visit: Payer: Managed Care, Other (non HMO) | Admitting: Internal Medicine

## 2023-06-15 ENCOUNTER — Encounter: Payer: Self-pay | Admitting: Internal Medicine

## 2023-06-15 ENCOUNTER — Ambulatory Visit (INDEPENDENT_AMBULATORY_CARE_PROVIDER_SITE_OTHER): Payer: Managed Care, Other (non HMO) | Admitting: Internal Medicine

## 2023-06-15 VITALS — BP 120/85 | HR 81 | Ht 64.5 in | Wt 209.0 lb

## 2023-06-15 DIAGNOSIS — Z23 Encounter for immunization: Secondary | ICD-10-CM | POA: Diagnosis not present

## 2023-06-15 DIAGNOSIS — E782 Mixed hyperlipidemia: Secondary | ICD-10-CM | POA: Diagnosis not present

## 2023-06-15 DIAGNOSIS — I10 Essential (primary) hypertension: Secondary | ICD-10-CM | POA: Diagnosis not present

## 2023-06-15 DIAGNOSIS — G43009 Migraine without aura, not intractable, without status migrainosus: Secondary | ICD-10-CM | POA: Insufficient documentation

## 2023-06-15 DIAGNOSIS — F411 Generalized anxiety disorder: Secondary | ICD-10-CM | POA: Diagnosis not present

## 2023-06-15 DIAGNOSIS — E1169 Type 2 diabetes mellitus with other specified complication: Secondary | ICD-10-CM

## 2023-06-15 DIAGNOSIS — K219 Gastro-esophageal reflux disease without esophagitis: Secondary | ICD-10-CM

## 2023-06-15 LAB — POCT CBG (FASTING - GLUCOSE)-MANUAL ENTRY: Glucose Fasting, POC: 104 mg/dL — AB (ref 70–99)

## 2023-06-15 MED ORDER — ALPRAZOLAM 0.25 MG PO TABS
0.2500 mg | ORAL_TABLET | Freq: Three times a day (TID) | ORAL | 0 refills | Status: DC | PRN
Start: 2023-06-15 — End: 2023-09-25

## 2023-06-15 NOTE — Progress Notes (Signed)
Established Patient Office Visit  Subjective:  Patient ID: Penny Myers, female    DOB: 1971/02/19  Age: 52 y.o. MRN: [id number]  Chief Complaint  Patient presents with   Follow-up    1 mo f/u    Patient comes in for her follow-up today.  She was seen by the neurologist and started on Effexor and Flexeril for her migraine control.  So she stopped taking her citalopram. Also evaluated by GI and her Protonix was increased to twice a day but she is not feeling any better.  Will be calling them back for an early EGD. Planning to travel soon, needs few tablets of Xanax for her anxiety on an airplane.    No other concerns at this time.   Past Medical History:  Diagnosis Date   Acid reflux    Anxiety    Atypical chest pain 07/02/2015   Note: Unchanged Note: Unchanged   Family history of malignant neoplasm 09/28/2015   Note: Unchanged   Heart murmur    Hypertension    States that she does not have HBP, but takes losartan for leaking valve.   Intussusception of intestine [facility])     Past Surgical History:  Procedure Laterality Date   APPENDECTOMY     CHOLECYSTECTOMY     SHOULDER ARTHROSCOPY WITH SUBACROMIAL DECOMPRESSION Left 12/09/2015   Procedure: SHOULDER ARTHROSCOPY WITH SUBACROMIAL DECOMPRESSION;  Surgeon: Loreta Ave, MD;  Location: [facility];  Service: Orthopedics;  Laterality: Left;   TUBAL LIGATION      Social History   Socioeconomic History   Marital status: Married    Spouse name: Not on file   Number of children: Not on file   Years of education: Not on file   Highest education level: Not on file  Occupational History   Not on file  Tobacco Use   Smoking status: Every Day    Current packs/day: 0.50    Types: Cigarettes   Smokeless tobacco: Never  Vaping Use   Vaping status: Never Used  Substance and Sexual Activity   Alcohol use: Yes    Comment: occ   Drug use: Yes    Types: Marijuana   Sexual activity: Not on file  Other  Topics Concern   Not on file  Social History Narrative   Not on file   Social Determinants of Health   Financial Resource Strain: Not on file  Food Insecurity: Not on file  Transportation Needs: Not on file  Physical Activity: Not on file  Stress: Not on file  Social Connections: Not on file  Intimate Partner Violence: Not on file    Family History  Problem Relation Age of Onset   Cancer Maternal Aunt    Breast cancer Maternal Aunt        40's   Gastric cancer Maternal Grandmother     No Known Allergies  Review of Systems  Constitutional: Negative.  Negative for chills, diaphoresis, fever, malaise/fatigue and weight loss.  HENT: Negative.  Negative for congestion, sinus pain and sore throat.   Eyes: Negative.   Respiratory: Negative.  Negative for cough and shortness of breath.   Cardiovascular: Negative.  Negative for chest pain, palpitations and leg swelling.  Gastrointestinal:  Positive for heartburn. Negative for abdominal pain, constipation, diarrhea, nausea and vomiting.  Genitourinary: Negative.  Negative for dysuria and flank pain.  Musculoskeletal: Negative.  Negative for joint pain and myalgias.  Skin: Negative.   Neurological: Negative.  Negative for dizziness and  headaches.  Endo/Heme/Allergies: Negative.   Psychiatric/Behavioral: Negative.  Negative for depression and suicidal ideas. The patient is not nervous/anxious.        Objective:   BP 120/85   Pulse 81   Wt 209 lb (94.8 kg)   SpO2 97%   BMI 35.32 kg/m   Vitals:   06/15/23 0922  BP: 120/85  Pulse: 81  Weight: 209 lb (94.8 kg)  SpO2: 97%  BMI (Calculated): 35.33    Physical Exam Vitals and nursing note reviewed.  Constitutional:      Appearance: Normal appearance.  HENT:     Head: Normocephalic and atraumatic.     Nose: Nose normal.     Mouth/Throat:     Mouth: Mucous membranes are moist.     Pharynx: Oropharynx is clear.  Eyes:     Conjunctiva/sclera: Conjunctivae normal.      Pupils: Pupils are equal, round, and reactive to light.  Cardiovascular:     Rate and Rhythm: Normal rate and regular rhythm.     Pulses: Normal pulses.     Heart sounds: Normal heart sounds. No murmur heard. Pulmonary:     Effort: Pulmonary effort is normal.     Breath sounds: Normal breath sounds. No wheezing.  Abdominal:     General: Bowel sounds are normal.     Palpations: Abdomen is soft.     Tenderness: There is no abdominal tenderness. There is no right CVA tenderness or left CVA tenderness.  Musculoskeletal:        General: Normal range of motion.     Cervical back: Normal range of motion.     Right lower leg: No edema.     Left lower leg: No edema.  Skin:    General: Skin is warm and dry.  Neurological:     General: No focal deficit present.     Mental Status: She is alert and oriented to person, place, and time.  Psychiatric:        Mood and Affect: Mood normal.        Behavior: Behavior normal.      Results for orders placed or performed in visit on 06/15/23  POCT CBG (Fasting - Glucose)  Result Value Ref Range   Glucose Fasting, POC 104 (A) 70 - 99 mg/dL    Recent Results (from the past 2160 hour(s))  POCT CBG (Fasting - Glucose)     Status: None   Collection Time: 05/11/23  2:36 PM  Result Value Ref Range   Glucose Fasting, POC 82 70 - 99 mg/dL  [id number]     Status: Abnormal   Collection Time: 05/11/23  3:30 PM  Result Value Ref Range   Glucose 90 70 - 99 mg/dL   BUN 10 6 - 24 mg/dL   Creatinine, Ser 3.08 (H) 0.57 - 1.00 mg/dL   eGFR 63 >65 HQ/ION/6.29   BUN/Creatinine Ratio 9 9 - 23   Sodium 145 (H) 134 - 144 mmol/L   Potassium 4.5 3.5 - 5.2 mmol/L   Chloride 106 96 - 106 mmol/L   CO2 24 20 - 29 mmol/L   Calcium 9.6 8.7 - 10.2 mg/dL   Total Protein 7.1 6.0 - 8.5 g/dL   Albumin 4.4 3.8 - 4.9 g/dL   Globulin, Total 2.7 1.5 - 4.5 g/dL   Bilirubin Total 0.3 0.0 - 1.2 mg/dL   Alkaline Phosphatase 90 44 - 121 IU/L   AST 15 0 - 40 IU/L   ALT 16 0 -  32 IU/L  Lipid Panel w/o Chol/HDL Ratio     Status: None   Collection Time: 05/11/23  3:30 PM  Result Value Ref Range   Cholesterol, Total 134 100 - 199 mg/dL   Triglycerides 74 0 - 149 mg/dL   HDL 53 >16 mg/dL   VLDL Cholesterol Cal 15 5 - 40 mg/dL   LDL Chol Calc (NIH) 66 0 - 99 mg/dL  Hemoglobin X0R     Status: Abnormal   Collection Time: 05/11/23  3:30 PM  Result Value Ref Range   Hgb A1c MFr Bld 6.0 (H) 4.8 - 5.6 %    Comment:          Prediabetes: 5.7 - 6.4          Diabetes: >6.4          Glycemic control for adults with diabetes: <7.0    Est. average glucose Bld gHb Est-mCnc 126 mg/dL  POCT CBG (Fasting - Glucose)     Status: Abnormal   Collection Time: 06/15/23  9:30 AM  Result Value Ref Range   Glucose Fasting, POC 104 (A) 70 - 99 mg/dL      Assessment & Plan:  Continue all medications. Will check labs at next visit. Problem List Items Addressed This Visit     Esophageal reflux   Essential hypertension, benign   Diabetes mellitus (HCC) - Primary   Relevant Orders   POCT CBG (Fasting - Glucose) (Completed)   GAD (generalized anxiety disorder)   Relevant Medications   ALPRAZolam (XANAX) 0.25 MG tablet   Mixed hyperlipidemia   Migraine without aura and without status migrainosus, not intractable    Return in about 2 months (around 08/15/2023).   Total time spent: 30 minutes  Margaretann Loveless, MD  06/15/2023   This document may have been prepared by [facility] Voice Recognition software and as such may include unintentional dictation errors.

## 2023-06-18 NOTE — Addendum Note (Signed)
Addended by: Margaretann Loveless on: 06/18/2023 02:03 PM   Modules accepted: Orders

## 2023-07-13 NOTE — Progress Notes (Unsigned)
Sleep Medicine   Office Visit  Patient Name: Penny Myers DOB: [birth year] MRN [id number]    Chief Complaint: sleep evaluation.   Brief History:  Calder presents for an initial consult for sleep evaluation and to establish care. Patient has a about 4 year history of snoring. Sleep quality is good. This is noted most nights. The patient's bed partner reports  snoring and witnessed apnea at night. The patient relates the following symptoms: snoring, morning headaches, gasping and excessive daytime sleepiness are also present. The patient goes to sleep at 1:30 am and wakes up at 7 am.  Sleep quality is same when outside home environment.  Patient has noted some restlessness of her legs at night that would disrupt her sleep.  The patient  relates no unusual behavior during the night.  The patient relates reports a history of psychiatric problems (anxiety)  The Epworth Sleepiness Score is 15 out of 24 .  The patient relates  Cardiovascular risk factors include: hypertension.     ROS  General: (-) fever, (-) chills, (-) night sweat Nose and Sinuses: (-) nasal stuffiness or itchiness, (-) postnasal drip, (-) nosebleeds, (-) sinus trouble. Mouth and Throat: (-) sore throat, (-) hoarseness. Neck: (-) swollen glands, (-) enlarged thyroid, (-) neck pain. Respiratory: - cough, - shortness of breath, - wheezing. Neurologic: - numbness, - tingling. Psychiatric: + anxiety, - depression Sleep behavior: -sleep paralysis -hypnogogic hallucinations -dream enactment      -vivid dreams -cataplexy -night terrors -sleep walking   Current Medication: Outpatient Encounter Medications as of 07/16/2023  Medication Sig   ALPRAZolam (XANAX) 0.25 MG tablet Take 1 tablet (0.25 mg total) by mouth 3 (three) times daily as needed for anxiety.   ASHWAGANDHA GUMMIES PO Take by mouth.   celecoxib (CELEBREX) 200 MG capsule Take 1 capsule (200 mg total) by mouth daily.   cyclobenzaprine (FLEXERIL) 10 MG tablet Take 10  mg by mouth 3 (three) times daily as needed.   diclofenac Sodium (VOLTAREN) 1 % GEL Voltaren 1% External Gel QTY: 50 gram Days: 30 Refills: 2  Written: 06/30/21 Patient Instructions: apply bid to the area for 2 weks   EMGALITY 120 MG/ML SOAJ Inject into the skin.   fluticasone (FLONASE) 50 MCG/ACT nasal spray Place into the nose.   glipiZIDE (GLUCOTROL XL) 2.5 MG 24 hr tablet TAKE ONE TABLET BY MOUTH ONE TIME DAILY   hydrochlorothiazide (MICROZIDE) 12.5 MG capsule TAKE ONE CAPSULE BY MOUTH ONE TIME DAILY IF BLOOD PRESSURE IS ELEVATED   loratadine (CLARITIN) 10 MG tablet Take by mouth.   losartan (COZAAR) 100 MG tablet TAKE ONE TABLET BY MOUTH ONE TIME DAILY   pantoprazole (PROTONIX) 40 MG tablet Take 1 tablet (40 mg total) by mouth 2 (two) times daily before a meal.   SUMAtriptan (IMITREX) 100 MG tablet Take 100 mg by mouth as directed.   venlafaxine (EFFEXOR) 37.5 MG tablet Take 37.5 mg by mouth.   No facility-administered encounter medications on file as of 07/16/2023.    Surgical History: Past Surgical History:  Procedure Laterality Date   APPENDECTOMY     CHOLECYSTECTOMY     SHOULDER ARTHROSCOPY WITH SUBACROMIAL DECOMPRESSION Left 12/09/2015   Procedure: SHOULDER ARTHROSCOPY WITH SUBACROMIAL DECOMPRESSION;  Surgeon: Loreta Ave, MD;  Location: [facility];  Service: Orthopedics;  Laterality: Left;   TUBAL LIGATION      Medical History: Past Medical History:  Diagnosis Date   Acid reflux    Anxiety    Atypical chest pain  07/02/2015   Note: Unchanged Note: Unchanged   Family history of malignant neoplasm 09/28/2015   Note: Unchanged   Heart murmur    Hypertension    States that she does not have HBP, but takes losartan for leaking valve.   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HCC)     Family History: Non contributory to the present illness  Social History: Social History   Socioeconomic History   Marital status: Married    Spouse name: Not on file   Number  of children: Not on file   Years of education: Not on file   Highest education level: Not on file  Occupational History   Not on file  Tobacco Use   Smoking status: Every Day    Current packs/day: 0.50    Types: Cigarettes   Smokeless tobacco: Never  Vaping Use   Vaping status: Never Used  Substance and Sexual Activity   Alcohol use: Yes    Comment: occ   Drug use: Yes    Types: Marijuana   Sexual activity: Not on file  Other Topics Concern   Not on file  Social History Narrative   Not on file   Social Determinants of Health   Financial Resource Strain: Not on file  Food Insecurity: Not on file  Transportation Needs: Not on file  Physical Activity: Not on file  Stress: Not on file  Social Connections: Not on file  Intimate Partner Violence: Not on file    Vital Signs: Blood pressure (!) 139/93, pulse 74, resp. rate 16, height 5\' 4"  (1.626 m), weight 203 lb (92.1 kg), SpO2 97%. Body mass index is 34.84 kg/m.   Examination: General Appearance: The patient is well-developed, well-nourished, and in no distress. Neck Circumference: 36 cm Skin: Gross inspection of skin unremarkable. Head: normocephalic, no gross deformities. Eyes: no gross deformities noted. ENT: ears appear grossly normal Neurologic: Alert and oriented. No involuntary movements.    STOP BANG RISK ASSESSMENT S (snore) Have you been told that you snore?     YES   T (tired) Are you often tired, fatigued, or sleepy during the day?   YES  O (obstruction) Do you stop breathing, choke, or gasp during sleep? YES   P (pressure) Do you have or are you being treated for high blood pressure? YES   B (BMI) Is your body index greater than 35 kg/m? NO   A (age) Are you 54 years old or older? YES   N (neck) Do you have a neck circumference greater than 16 inches?   NO   G (gender) Are you a female? NO   TOTAL STOP/BANG "YES" ANSWERS 5                                                               A  STOP-Bang score of 2 or less is considered low risk, and a score of 5 or more is high risk for having either moderate or severe OSA. For people who score 3 or 4, doctors may need to perform further assessment to determine how likely they are to have OSA.         EPWORTH SLEEPINESS SCALE:  Scale:  (0)= no chance of dozing; (1)= slight chance of dozing; (2)= moderate chance of dozing; (3)= high chance  of dozing  Chance  Situtation    Sitting and reading: 3    Watching TV: 3    Sitting Inactive in public: 3    As a passenger in car: 3      Lying down to rest: 0    Sitting and talking: 0    Sitting quielty after lunch: 3    In a car, stopped in traffic: 0   TOTAL SCORE:   15 out of 24    SLEEP STUDIES:  HST about 2 years ago, negative for OSA.   LABS: Recent Results (from the past 2160 hour(s))  POCT CBG (Fasting - Glucose)     Status: None   Collection Time: 05/11/23  2:36 PM  Result Value Ref Range   Glucose Fasting, POC 82 70 - 99 mg/dL  [id number]     Status: Abnormal   Collection Time: 05/11/23  3:30 PM  Result Value Ref Range   Glucose 90 70 - 99 mg/dL   BUN 10 6 - 24 mg/dL   Creatinine, Ser 7.32 (H) 0.57 - 1.00 mg/dL   eGFR 63 >20 UR/KYH/0.62   BUN/Creatinine Ratio 9 9 - 23   Sodium 145 (H) 134 - 144 mmol/L   Potassium 4.5 3.5 - 5.2 mmol/L   Chloride 106 96 - 106 mmol/L   CO2 24 20 - 29 mmol/L   Calcium 9.6 8.7 - 10.2 mg/dL   Total Protein 7.1 6.0 - 8.5 g/dL   Albumin 4.4 3.8 - 4.9 g/dL   Globulin, Total 2.7 1.5 - 4.5 g/dL   Bilirubin Total 0.3 0.0 - 1.2 mg/dL   Alkaline Phosphatase 90 44 - 121 IU/L   AST 15 0 - 40 IU/L   ALT 16 0 - 32 IU/L  Lipid Panel w/o Chol/HDL Ratio     Status: None   Collection Time: 05/11/23  3:30 PM  Result Value Ref Range   Cholesterol, Total 134 100 - 199 mg/dL   Triglycerides 74 0 - 149 mg/dL   HDL 53 >37 mg/dL   VLDL Cholesterol Cal 15 5 - 40 mg/dL   LDL Chol Calc (NIH) 66 0 - 99 mg/dL  Hemoglobin S2G     Status:  Abnormal   Collection Time: 05/11/23  3:30 PM  Result Value Ref Range   Hgb A1c MFr Bld 6.0 (H) 4.8 - 5.6 %    Comment:          Prediabetes: 5.7 - 6.4          Diabetes: >6.4          Glycemic control for adults with diabetes: <7.0    Est. average glucose Bld gHb Est-mCnc 126 mg/dL  POCT CBG (Fasting - Glucose)     Status: Abnormal   Collection Time: 06/15/23  9:30 AM  Result Value Ref Range   Glucose Fasting, POC 104 (A) 70 - 99 mg/dL    Radiology: MM 3D SCREENING MAMMOGRAM BILATERAL BREAST  Result Date: 05/22/2023 CLINICAL DATA:  Screening. EXAM: DIGITAL SCREENING BILATERAL MAMMOGRAM WITH TOMOSYNTHESIS AND CAD TECHNIQUE: Bilateral screening digital craniocaudal and mediolateral oblique mammograms were obtained. Bilateral screening digital breast tomosynthesis was performed. The images were evaluated with computer-aided detection. COMPARISON:  Previous exam(s). ACR Breast Density Category a: The breasts are almost entirely fatty. FINDINGS: There are no findings suspicious for malignancy. IMPRESSION: No mammographic evidence of malignancy. A result letter of this screening mammogram will be mailed directly to the patient. RECOMMENDATION: Screening mammogram in one year. (Code:SM-B-01Y) BI-RADS  CATEGORY  1: Negative. Electronically Signed   By: Edwin Cap M.D.   On: 05/22/2023 09:59    No results found.  No results found.    Assessment and Plan: Patient Active Problem List   Diagnosis Date Noted   Witnessed episode of apnea 07/16/2023   GAD (generalized anxiety disorder) 06/15/2023   Mixed hyperlipidemia 06/15/2023   Migraine without aura and without status migrainosus, not intractable 06/15/2023   Diabetes mellitus (HCC) 03/08/2023   Epigastric pain 03/08/2023   OSA (obstructive sleep apnea) 03/08/2023   Lymphocytosis 01/29/2022   Bilateral high frequency sensorineural hearing loss 12/08/2021   Chronic daily headache 11/10/2021   Hypertrophy of nasal turbinates  11/10/2021   Tinnitus of left ear 11/10/2021   Benign neoplasm of bronchus and lung 09/28/2015   Essential hypertension, benign 09/28/2015   Current smoker 07/02/2015   Allergic rhinitis 07/19/2007   Esophageal reflux 09/05/2006   Iron deficiency anemia 09/05/2006   1. Witnessed episode of apnea Patient evaluation suggests high risk of sleep disordered breathing due to observed apnea, snoring, morning headaches, daytime somnolence.  Patient has comorbid cardiovascular risk factors including: hypertension which could be exacerbated by pathologic sleep-disordered breathing.  Suggest: PSG to assess/treat the patient's sleep disordered breathing. The patient was also counselled on weight loss to optimize sleep health.   2. Chronic daily headache Await psg. Seeing neurology for treatment.   3. Essential hypertension, benign Hypertension Counseling:   The following hypertensive lifestyle modification were recommended and discussed:  1. Limiting alcohol intake to less than 1 oz/day of ethanol:(24 oz of beer or 8 oz of wine or 2 oz of 100-proof whiskey). 2. Take baby ASA 81 mg daily. 3. Importance of regular aerobic exercise and losing weight. 4. Reduce dietary saturated fat and cholesterol intake for overall cardiovascular health. 5. Maintaining adequate dietary potassium, calcium, and magnesium intake. 6. Regular monitoring of the blood pressure. 7. Reduce sodium intake to less than 100 mmol/day (less than 2.3 gm of sodium or less than 6 gm of sodium choride)        General Counseling: I have discussed the findings of the evaluation and examination with Burna Mortimer.  I have also discussed any further diagnostic evaluation thatmay be needed or ordered today. Donishia verbalizes understanding of the findings of todays visit. We also reviewed her medications today and discussed drug interactions and side effects including but not limited excessive drowsiness and altered mental states. We also discussed  that there is always a risk not just to her but also people around her. she has been encouraged to call the office with any questions or concerns that should arise related to todays visit.  No orders of the defined types were placed in this encounter.       I have personally obtained a history, evaluated the patient, evaluated pertinent data, formulated the assessment and plan and placed orders.   This patient was seen today by Emmaline Kluver, PA-C in collaboration with Dr. Freda Munro.   Yevonne Pax, MD [facility] Diplomate ABMS Pulmonary and Critical Care Medicine Sleep medicine

## 2023-07-16 ENCOUNTER — Ambulatory Visit (INDEPENDENT_AMBULATORY_CARE_PROVIDER_SITE_OTHER): Payer: Managed Care, Other (non HMO) | Admitting: Internal Medicine

## 2023-07-16 VITALS — BP 139/93 | HR 74 | Resp 16 | Ht 64.0 in | Wt 203.0 lb

## 2023-07-16 DIAGNOSIS — R0681 Apnea, not elsewhere classified: Secondary | ICD-10-CM | POA: Diagnosis not present

## 2023-07-16 DIAGNOSIS — R519 Headache, unspecified: Secondary | ICD-10-CM

## 2023-07-16 DIAGNOSIS — I10 Essential (primary) hypertension: Secondary | ICD-10-CM

## 2023-08-06 ENCOUNTER — Encounter: Payer: Self-pay | Admitting: Internal Medicine

## 2023-08-14 ENCOUNTER — Telehealth: Payer: Self-pay

## 2023-08-15 NOTE — Telephone Encounter (Signed)
Patient called with sinus infection. Asked for Zpak. Called her back and told her she needs to make appointment.

## 2023-08-20 ENCOUNTER — Ambulatory Visit: Payer: Managed Care, Other (non HMO) | Admitting: Internal Medicine

## 2023-08-20 ENCOUNTER — Encounter: Payer: Self-pay | Admitting: Internal Medicine

## 2023-08-20 VITALS — BP 118/80 | HR 87 | Ht 64.0 in | Wt 212.2 lb

## 2023-08-20 DIAGNOSIS — Z0130 Encounter for examination of blood pressure without abnormal findings: Secondary | ICD-10-CM

## 2023-08-20 DIAGNOSIS — J301 Allergic rhinitis due to pollen: Secondary | ICD-10-CM | POA: Diagnosis not present

## 2023-08-20 DIAGNOSIS — R0789 Other chest pain: Secondary | ICD-10-CM | POA: Diagnosis not present

## 2023-08-20 DIAGNOSIS — J0110 Acute frontal sinusitis, unspecified: Secondary | ICD-10-CM | POA: Diagnosis not present

## 2023-08-20 DIAGNOSIS — E119 Type 2 diabetes mellitus without complications: Secondary | ICD-10-CM | POA: Diagnosis not present

## 2023-08-20 LAB — GLUCOSE, POCT (MANUAL RESULT ENTRY): POC Glucose: 157 mg/dL — AB (ref 70–99)

## 2023-08-20 MED ORDER — FLUTICASONE PROPIONATE 50 MCG/ACT NA SUSP
1.0000 | Freq: Every day | NASAL | 3 refills | Status: DC
Start: 2023-08-20 — End: 2024-05-15

## 2023-08-20 MED ORDER — CELECOXIB 200 MG PO CAPS
200.0000 mg | ORAL_CAPSULE | Freq: Every day | ORAL | 2 refills | Status: AC
Start: 2023-08-20 — End: 2024-08-19

## 2023-08-20 MED ORDER — AMOXICILLIN-POT CLAVULANATE 875-125 MG PO TABS: 1.0000 | ORAL_TABLET | Freq: Two times a day (BID) | ORAL | 0 refills | Status: DC

## 2023-08-20 NOTE — Progress Notes (Signed)
Established Patient Office Visit  Subjective:  Patient ID: Penny Myers, female    DOB: 02-Apr-1971  Age: 52 y.o. MRN: [id number]  Chief Complaint  Patient presents with   Follow-up    2 month    Patient comes in with 1 week history of headache, sinus congestion, postnasal drip and sore throat.  Patient checked her COVID test at home which was negative.  Now her sinus congestion is getting worse and she is noticing Colored nasal discharge.  No fever but feels the chills and fatigue.  Husband and granddaughter are also unwell.  Since has been a week will start of p.o. Augmentin, rest, fluids and Tylenol as needed.    No other concerns at this time.   Past Medical History:  Diagnosis Date   Acid reflux    Anxiety    Atypical chest pain 07/02/2015   Note: Unchanged Note: Unchanged   Family history of malignant neoplasm 09/28/2015   Note: Unchanged   Heart murmur    Hypertension    States that she does not have HBP, but takes losartan for leaking valve.   Intussusception of intestine [facility])     Past Surgical History:  Procedure Laterality Date   APPENDECTOMY     CHOLECYSTECTOMY     SHOULDER ARTHROSCOPY WITH SUBACROMIAL DECOMPRESSION Left 12/09/2015   Procedure: SHOULDER ARTHROSCOPY WITH SUBACROMIAL DECOMPRESSION;  Surgeon: Loreta Ave, MD;  Location: [facility];  Service: Orthopedics;  Laterality: Left;   TUBAL LIGATION      Social History   Socioeconomic History   Marital status: Married    Spouse name: Not on file   Number of children: Not on file   Years of education: Not on file   Highest education level: Not on file  Occupational History   Not on file  Tobacco Use   Smoking status: Every Day    Current packs/day: 0.50    Types: Cigarettes   Smokeless tobacco: Never  Vaping Use   Vaping status: Never Used  Substance and Sexual Activity   Alcohol use: Yes    Comment: occ   Drug use: Yes    Types: Marijuana   Sexual activity: Not on  file  Other Topics Concern   Not on file  Social History Narrative   Not on file   Social Determinants of Health   Financial Resource Strain: Not on file  Food Insecurity: Not on file  Transportation Needs: Not on file  Physical Activity: Not on file  Stress: Not on file  Social Connections: Not on file  Intimate Partner Violence: Not on file    Family History  Problem Relation Age of Onset   Cancer Maternal Aunt    Breast cancer Maternal Aunt        40's   Gastric cancer Maternal Grandmother     No Known Allergies  Outpatient Medications Prior to Visit  Medication Sig Note   ALPRAZolam (XANAX) 0.25 MG tablet Take 1 tablet (0.25 mg total) by mouth 3 (three) times daily as needed for anxiety.    ASHWAGANDHA GUMMIES PO Take by mouth.    cyclobenzaprine (FLEXERIL) 10 MG tablet Take 10 mg by mouth 3 (three) times daily as needed.    diclofenac Sodium (VOLTAREN) 1 % GEL Voltaren 1% External Gel QTY: 50 gram Days: 30 Refills: 2  Written: 06/30/21 Patient Instructions: apply bid to the area for 2 weks    EMGALITY 120 MG/ML SOAJ Inject into the skin.  glipiZIDE (GLUCOTROL XL) 2.5 MG 24 hr tablet TAKE ONE TABLET BY MOUTH ONE TIME DAILY    hydrochlorothiazide (MICROZIDE) 12.5 MG capsule TAKE ONE CAPSULE BY MOUTH ONE TIME DAILY IF BLOOD PRESSURE IS ELEVATED    loratadine (CLARITIN) 10 MG tablet Take by mouth.    losartan (COZAAR) 100 MG tablet TAKE ONE TABLET BY MOUTH ONE TIME DAILY    pantoprazole (PROTONIX) 40 MG tablet Take 1 tablet (40 mg total) by mouth 2 (two) times daily before a meal.    SUMAtriptan (IMITREX) 100 MG tablet Take 100 mg by mouth as directed.    venlafaxine (EFFEXOR) 37.5 MG tablet Take 37.5 mg by mouth.    [DISCONTINUED] fluticasone (FLONASE) 50 MCG/ACT nasal spray Place into the nose. 08/20/2023: Needs refill   [DISCONTINUED] celecoxib (CELEBREX) 200 MG capsule Take 1 capsule (200 mg total) by mouth daily. (Patient not taking: Reported on 08/20/2023)    No  facility-administered medications prior to visit.    Review of Systems  Constitutional:  Positive for chills and malaise/fatigue. Negative for fever.  HENT:  Positive for congestion, sinus pain and sore throat.   Eyes: Negative.  Negative for blurred vision, double vision and photophobia.  Respiratory:  Positive for cough. Negative for shortness of breath and wheezing.   Cardiovascular: Negative.  Negative for chest pain, palpitations and leg swelling.  Gastrointestinal: Negative.  Negative for abdominal pain, constipation, diarrhea, heartburn, nausea and vomiting.  Genitourinary: Negative.  Negative for dysuria and flank pain.  Musculoskeletal: Negative.  Negative for joint pain and myalgias.  Skin: Negative.  Negative for itching and rash.  Neurological: Negative.  Negative for dizziness and headaches.  Endo/Heme/Allergies: Negative.   Psychiatric/Behavioral: Negative.  Negative for depression and suicidal ideas. The patient is not nervous/anxious.        Objective:   BP 118/80   Pulse 87   Ht 5\' 4"  (1.626 m)   Wt 212 lb 3.2 oz (96.3 kg)   SpO2 96%   BMI 36.42 kg/m   Vitals:   08/20/23 1451  BP: 118/80  Pulse: 87  Height: 5\' 4"  (1.626 m)  Weight: 212 lb 3.2 oz (96.3 kg)  SpO2: 96%  BMI (Calculated): 36.41    Physical Exam Vitals and nursing note reviewed.  Constitutional:      Appearance: Normal appearance.  HENT:     Head: Normocephalic and atraumatic.     Nose: Nose normal.     Mouth/Throat:     Mouth: Mucous membranes are moist.     Pharynx: Oropharynx is clear.  Eyes:     Conjunctiva/sclera: Conjunctivae normal.     Pupils: Pupils are equal, round, and reactive to light.  Cardiovascular:     Rate and Rhythm: Normal rate and regular rhythm.     Pulses: Normal pulses.     Heart sounds: Normal heart sounds. No murmur heard. Pulmonary:     Effort: Pulmonary effort is normal.     Breath sounds: Normal breath sounds. No wheezing.  Abdominal:     General:  Bowel sounds are normal.     Palpations: Abdomen is soft.     Tenderness: There is no abdominal tenderness. There is no right CVA tenderness or left CVA tenderness.  Musculoskeletal:        General: Normal range of motion.     Cervical back: Normal range of motion.     Right lower leg: No edema.     Left lower leg: No edema.  Skin:  General: Skin is warm and dry.  Neurological:     General: No focal deficit present.     Mental Status: She is alert and oriented to person, place, and time.  Psychiatric:        Mood and Affect: Mood normal.        Behavior: Behavior normal.      Results for orders placed or performed in visit on 08/20/23  POCT Glucose (CBG)  Result Value Ref Range   POC Glucose 157 (A) 70 - 99 mg/dl    Recent Results (from the past 2160 hour(s))  POCT CBG (Fasting - Glucose)     Status: Abnormal   Collection Time: 06/15/23  9:30 AM  Result Value Ref Range   Glucose Fasting, POC 104 (A) 70 - 99 mg/dL  POCT Glucose (CBG)     Status: Abnormal   Collection Time: 08/20/23  2:59 PM  Result Value Ref Range   POC Glucose 157 (A) 70 - 99 mg/dl      Assessment & Plan:  Patient to start all the medications as prescribed. Will return for lab work. Problem List Items Addressed This Visit     Allergic rhinitis   Relevant Medications   fluticasone (FLONASE) 50 MCG/ACT nasal spray   Diabetes mellitus (HCC)   Relevant Orders   POCT Glucose (CBG) (Completed)   Other Visit Diagnoses     Acute non-recurrent frontal sinusitis    -  Primary   Relevant Medications   amoxicillin-clavulanate (AUGMENTIN) 875-125 MG tablet   fluticasone (FLONASE) 50 MCG/ACT nasal spray   Right-sided chest wall pain       Relevant Medications   celecoxib (CELEBREX) 200 MG capsule       Follow up 10-14 days.  Total time spent: 25 minutes  Margaretann Loveless, MD  08/20/2023   This document may have been prepared by [facility] Voice Recognition software and as such may include  unintentional dictation errors.

## 2023-08-30 ENCOUNTER — Telehealth: Payer: Self-pay | Admitting: Internal Medicine

## 2023-08-30 NOTE — Telephone Encounter (Signed)
Patient left VM that she wants her sleep study results sent to a different DME company.

## 2023-09-03 ENCOUNTER — Ambulatory Visit: Payer: Managed Care, Other (non HMO) | Admitting: Internal Medicine

## 2023-09-07 ENCOUNTER — Ambulatory Visit: Payer: Managed Care, Other (non HMO) | Admitting: Internal Medicine

## 2023-09-10 ENCOUNTER — Other Ambulatory Visit: Payer: Managed Care, Other (non HMO)

## 2023-09-10 ENCOUNTER — Other Ambulatory Visit: Payer: Self-pay | Admitting: Internal Medicine

## 2023-09-10 DIAGNOSIS — E1169 Type 2 diabetes mellitus with other specified complication: Secondary | ICD-10-CM

## 2023-09-10 DIAGNOSIS — E119 Type 2 diabetes mellitus without complications: Secondary | ICD-10-CM

## 2023-09-10 DIAGNOSIS — E1159 Type 2 diabetes mellitus with other circulatory complications: Secondary | ICD-10-CM

## 2023-09-11 LAB — CMP14+EGFR
ALT: 16 [IU]/L (ref 0–32)
AST: 12 [IU]/L (ref 0–40)
Albumin: 4.1 g/dL (ref 3.8–4.9)
Alkaline Phosphatase: 87 [IU]/L (ref 44–121)
BUN/Creatinine Ratio: 7 — ABNORMAL LOW (ref 9–23)
BUN: 8 mg/dL (ref 6–24)
Bilirubin Total: 0.4 mg/dL (ref 0.0–1.2)
CO2: 27 mmol/L (ref 20–29)
Calcium: 9.3 mg/dL (ref 8.7–10.2)
Chloride: 106 mmol/L (ref 96–106)
Creatinine, Ser: 1.09 mg/dL — ABNORMAL HIGH (ref 0.57–1.00)
Globulin, Total: 2.2 g/dL (ref 1.5–4.5)
Glucose: 99 mg/dL (ref 70–99)
Potassium: 4.2 mmol/L (ref 3.5–5.2)
Sodium: 144 mmol/L (ref 134–144)
Total Protein: 6.3 g/dL (ref 6.0–8.5)
eGFR: 60 mL/min/{1.73_m2} (ref >59)

## 2023-09-11 LAB — HEMOGLOBIN A1C
Est. average glucose Bld gHb Est-mCnc: 134 mg/dL
Hgb A1c MFr Bld: 6.3 % — ABNORMAL HIGH (ref 4.8–5.6)

## 2023-09-11 LAB — LIPID PANEL W/O CHOL/HDL RATIO
Cholesterol, Total: 135 mg/dL (ref 100–199)
HDL: 45 mg/dL (ref >39)
LDL Chol Calc (NIH): 69 mg/dL (ref 0–99)
Triglycerides: 115 mg/dL (ref 0–149)
VLDL Cholesterol Cal: 21 mg/dL (ref 5–40)

## 2023-09-14 ENCOUNTER — Ambulatory Visit: Payer: Managed Care, Other (non HMO) | Admitting: Internal Medicine

## 2023-09-14 ENCOUNTER — Encounter: Payer: Self-pay | Admitting: Internal Medicine

## 2023-09-14 VITALS — BP 122/84 | HR 82 | Ht 64.0 in | Wt 208.4 lb

## 2023-09-14 DIAGNOSIS — E119 Type 2 diabetes mellitus without complications: Secondary | ICD-10-CM

## 2023-09-14 DIAGNOSIS — Z0130 Encounter for examination of blood pressure without abnormal findings: Secondary | ICD-10-CM

## 2023-09-14 MED ORDER — EMPAGLIFLOZIN 25 MG PO TABS: 25.0000 mg | ORAL_TABLET | Freq: Every day | ORAL | 2 refills | Status: DC

## 2023-09-14 NOTE — Progress Notes (Signed)
Established Patient Office Visit  Subjective:  Patient ID: Penny Myers, female    DOB: Sep 02, 1971  Age: 52 y.o. MRN: [id number]  No chief complaint on file.   Patient comes in for follow-up today.  She is feeling better and her sinus symptoms have resolved.  Labs were done and results discussed today.  She is currently on glipizide 2.5 mg.  Her creatinine is slightly above normal.  The hemoglobin A1c is 6.3.  Will benefit from [place], started 25 mg once a day.  Can stop her glipizide from the meantime.    No other concerns at this time.   Past Medical History:  Diagnosis Date   Acid reflux    Anxiety    Atypical chest pain 07/02/2015   Note: Unchanged Note: Unchanged   Family history of malignant neoplasm 09/28/2015   Note: Unchanged   Heart murmur    Hypertension    States that she does not have HBP, but takes losartan for leaking valve.   Intussusception of intestine [facility])     Past Surgical History:  Procedure Laterality Date   APPENDECTOMY     CHOLECYSTECTOMY     SHOULDER ARTHROSCOPY WITH SUBACROMIAL DECOMPRESSION Left 12/09/2015   Procedure: SHOULDER ARTHROSCOPY WITH SUBACROMIAL DECOMPRESSION;  Surgeon: Loreta Ave, MD;  Location: [facility];  Service: Orthopedics;  Laterality: Left;   TUBAL LIGATION      Social History   Socioeconomic History   Marital status: Married    Spouse name: Not on file   Number of children: Not on file   Years of education: Not on file   Highest education level: Not on file  Occupational History   Not on file  Tobacco Use   Smoking status: Every Day    Current packs/day: 0.50    Types: Cigarettes   Smokeless tobacco: Never  Vaping Use   Vaping status: Never Used  Substance and Sexual Activity   Alcohol use: Yes    Comment: occ   Drug use: Yes    Types: Marijuana   Sexual activity: Not on file  Other Topics Concern   Not on file  Social History Narrative   Not on file   Social Drivers of Health    Financial Resource Strain: Not on file  Food Insecurity: Not on file  Transportation Needs: Not on file  Physical Activity: Not on file  Stress: Not on file  Social Connections: Not on file  Intimate Partner Violence: Not on file    Family History  Problem Relation Age of Onset   Cancer Maternal Aunt    Breast cancer Maternal Aunt        40's   Gastric cancer Maternal Grandmother     No Known Allergies  Outpatient Medications Prior to Visit  Medication Sig   ALPRAZolam (XANAX) 0.25 MG tablet Take 1 tablet (0.25 mg total) by mouth 3 (three) times daily as needed for anxiety.   amoxicillin-clavulanate (AUGMENTIN) 875-125 MG tablet Take 1 tablet by mouth 2 (two) times daily.   ASHWAGANDHA GUMMIES PO Take by mouth.   celecoxib (CELEBREX) 200 MG capsule Take 1 capsule (200 mg total) by mouth daily.   cyclobenzaprine (FLEXERIL) 10 MG tablet Take 10 mg by mouth 3 (three) times daily as needed.   diclofenac Sodium (VOLTAREN) 1 % GEL Voltaren 1% External Gel QTY: 50 gram Days: 30 Refills: 2  Written: 06/30/21 Patient Instructions: apply bid to the area for 2 weks   EMGALITY 120 MG/ML  SOAJ Inject into the skin.   fluticasone (FLONASE) 50 MCG/ACT nasal spray Place 1 spray into both nostrils daily.   glipiZIDE (GLUCOTROL XL) 2.5 MG 24 hr tablet TAKE ONE TABLET BY MOUTH ONE TIME DAILY   hydrochlorothiazide (MICROZIDE) 12.5 MG capsule TAKE ONE CAPSULE BY MOUTH ONE TIME DAILY IF BLOOD PRESSURE IS ELEVATED   loratadine (CLARITIN) 10 MG tablet Take by mouth.   losartan (COZAAR) 100 MG tablet TAKE ONE TABLET BY MOUTH ONE TIME DAILY   pantoprazole (PROTONIX) 40 MG tablet Take 1 tablet (40 mg total) by mouth 2 (two) times daily before a meal.   SUMAtriptan (IMITREX) 100 MG tablet Take 100 mg by mouth as directed.   venlafaxine (EFFEXOR) 37.5 MG tablet Take 37.5 mg by mouth.   No facility-administered medications prior to visit.    Review of Systems  Constitutional: Negative.  Negative for  chills, fever, malaise/fatigue and weight loss.  HENT: Negative.  Negative for congestion, sinus pain and sore throat.   Eyes: Negative.   Respiratory: Negative.  Negative for cough, shortness of breath and stridor.   Cardiovascular: Negative.  Negative for chest pain, palpitations and leg swelling.  Gastrointestinal: Negative.  Negative for abdominal pain, constipation, diarrhea, heartburn, nausea and vomiting.  Genitourinary: Negative.  Negative for dysuria and flank pain.  Musculoskeletal: Negative.  Negative for joint pain and myalgias.  Skin: Negative.   Neurological: Negative.  Negative for dizziness, tingling, tremors and headaches.  Endo/Heme/Allergies: Negative.   Psychiatric/Behavioral: Negative.  Negative for depression and suicidal ideas. The patient is not nervous/anxious.        Objective:   BP 122/84   Pulse 82   Wt 208 lb 6.4 oz (94.5 kg)   SpO2 97%   BMI 35.77 kg/m   Vitals:   09/14/23 1054  BP: 122/84  Pulse: 82  Weight: 208 lb 6.4 oz (94.5 kg)  SpO2: 97%  BMI (Calculated): 35.75    Physical Exam Vitals and nursing note reviewed.  Constitutional:      Appearance: Normal appearance.  HENT:     Head: Normocephalic and atraumatic.     Nose: Nose normal.     Mouth/Throat:     Mouth: Mucous membranes are moist.     Pharynx: Oropharynx is clear.  Eyes:     Conjunctiva/sclera: Conjunctivae normal.     Pupils: Pupils are equal, round, and reactive to light.  Cardiovascular:     Rate and Rhythm: Normal rate and regular rhythm.     Pulses: Normal pulses.     Heart sounds: Normal heart sounds. No murmur heard. Pulmonary:     Effort: Pulmonary effort is normal.     Breath sounds: Normal breath sounds. No wheezing.  Abdominal:     General: Bowel sounds are normal.     Palpations: Abdomen is soft.     Tenderness: There is no abdominal tenderness. There is no right CVA tenderness or left CVA tenderness.  Musculoskeletal:        General: Normal range of  motion.     Cervical back: Normal range of motion.     Right lower leg: No edema.     Left lower leg: No edema.  Skin:    General: Skin is warm and dry.  Neurological:     General: No focal deficit present.     Mental Status: She is alert and oriented to person, place, and time.  Psychiatric:        Mood and Affect: Mood normal.  Behavior: Behavior normal.      No results found for any visits on 09/14/23.  Recent Results (from the past 2160 hours)  POCT Glucose (CBG)     Status: Abnormal   Collection Time: 08/20/23  2:59 PM  Result Value Ref Range   POC Glucose 157 (A) 70 - 99 mg/dl  [id number]     Status: Abnormal   Collection Time: 09/10/23 10:53 AM  Result Value Ref Range   Glucose 99 70 - 99 mg/dL   BUN 8 6 - 24 mg/dL   Creatinine, Ser 4.09 (H) 0.57 - 1.00 mg/dL   eGFR 60 >81 XB/JYN/8.29   BUN/Creatinine Ratio 7 (L) 9 - 23   Sodium 144 134 - 144 mmol/L   Potassium 4.2 3.5 - 5.2 mmol/L   Chloride 106 96 - 106 mmol/L   CO2 27 20 - 29 mmol/L   Calcium 9.3 8.7 - 10.2 mg/dL   Total Protein 6.3 6.0 - 8.5 g/dL   Albumin 4.1 3.8 - 4.9 g/dL   Globulin, Total 2.2 1.5 - 4.5 g/dL   Bilirubin Total 0.4 0.0 - 1.2 mg/dL   Alkaline Phosphatase 87 44 - 121 IU/L   AST 12 0 - 40 IU/L   ALT 16 0 - 32 IU/L  Lipid Panel w/o Chol/HDL Ratio     Status: None   Collection Time: 09/10/23 10:53 AM  Result Value Ref Range   Cholesterol, Total 135 100 - 199 mg/dL   Triglycerides 562 0 - 149 mg/dL   HDL 45 >13 mg/dL   VLDL Cholesterol Cal 21 5 - 40 mg/dL   LDL Chol Calc (NIH) 69 0 - 99 mg/dL  Hemoglobin Y8M     Status: Abnormal   Collection Time: 09/10/23 10:53 AM  Result Value Ref Range   Hgb A1c MFr Bld 6.3 (H) 4.8 - 5.6 %    Comment:          Prediabetes: 5.7 - 6.4          Diabetes: >6.4          Glycemic control for adults with diabetes: <7.0    Est. average glucose Bld gHb Est-mCnc 134 mg/dL      Assessment & Plan:  Patient will continue all her medications.  Can stop  the glipizide.  Start Jardiance 25 mg once a day.  Strict diet control emphasized. Problem List Items Addressed This Visit     Diabetes mellitus (HCC) - Primary   Relevant Medications   empagliflozin (JARDIANCE) 25 MG TABS tablet    Return in about 1 month (around 10/15/2023).   Total time spent: 30 minutes  Margaretann Loveless, MD  09/14/2023   This document may have been prepared by [facility] Voice Recognition software and as such may include unintentional dictation errors.

## 2023-09-25 ENCOUNTER — Encounter: Payer: Self-pay | Admitting: Internal Medicine

## 2023-09-25 ENCOUNTER — Ambulatory Visit: Payer: Managed Care, Other (non HMO) | Admitting: Internal Medicine

## 2023-09-25 VITALS — BP 122/84 | HR 89 | Ht 64.5 in | Wt 211.2 lb

## 2023-09-25 DIAGNOSIS — G4733 Obstructive sleep apnea (adult) (pediatric): Secondary | ICD-10-CM | POA: Diagnosis not present

## 2023-09-25 DIAGNOSIS — J0111 Acute recurrent frontal sinusitis: Secondary | ICD-10-CM | POA: Diagnosis not present

## 2023-09-25 DIAGNOSIS — E1159 Type 2 diabetes mellitus with other circulatory complications: Secondary | ICD-10-CM | POA: Diagnosis not present

## 2023-09-25 DIAGNOSIS — E1169 Type 2 diabetes mellitus with other specified complication: Secondary | ICD-10-CM

## 2023-09-25 DIAGNOSIS — I152 Hypertension secondary to endocrine disorders: Secondary | ICD-10-CM | POA: Insufficient documentation

## 2023-09-25 DIAGNOSIS — E785 Hyperlipidemia, unspecified: Secondary | ICD-10-CM

## 2023-09-25 DIAGNOSIS — F411 Generalized anxiety disorder: Secondary | ICD-10-CM

## 2023-09-25 MED ORDER — LEVOFLOXACIN 500 MG PO TABS: 500.0000 mg | ORAL_TABLET | Freq: Every day | ORAL | 0 refills | Status: DC

## 2023-09-25 NOTE — Progress Notes (Signed)
 Established Patient Office Visit  Subjective:  Patient ID: Penny Myers, female    DOB: [birth year]  Age: 53 y.o. MRN: [id number]  Chief Complaint  Patient presents with   Nasal Congestion   Shoulder Pain    Left    Patient comes in with reports of head and sinus congestion, postnasal drip, cough, production of yellow-green sputum as well as nasal discharge.  Patient was recently started on auto titrating CPAP unit, and thinks her symptoms got worse.  Her home COVID test was negative. Also mentions pain along her left shoulder and left upper back, which radiates around the left chest wall and the muscles are sore to touch.  There is no rash or burning pain and she  does not recall lifting anything heavy or pushing.  But it is the same shoulder that had surgery done.  Patient advised to use her Voltaren gel.  She will contact her orthopedic surgeon and may need some more physical therapy. She was given a prescription for Jardiance  at her last visit but still has not received it from the pharmacy.  Meanwhile she is still taking her Glucotrol.    No other concerns at this time.   Past Medical History:  Diagnosis Date   Acid reflux    Anxiety    Atypical chest pain 07/02/2015   Note: Unchanged Note: Unchanged   Family history of malignant neoplasm 09/28/2015   Note: Unchanged   Heart murmur    Hypertension    States that she does not have HBP, but takes losartan for leaking valve.   Intussusception of intestine [facility])     Past Surgical History:  Procedure Laterality Date   APPENDECTOMY     CHOLECYSTECTOMY     SHOULDER ARTHROSCOPY WITH SUBACROMIAL DECOMPRESSION Left 12/09/2015   Procedure: SHOULDER ARTHROSCOPY WITH SUBACROMIAL DECOMPRESSION;  Surgeon: Toribio JULIANNA Chancy, MD;  Location: [facility];  Service: Orthopedics;  Laterality: Left;   TUBAL LIGATION      Social History   Socioeconomic History   Marital status: Married    Spouse name: Not on file   Number  of children: Not on file   Years of education: Not on file   Highest education level: Not on file  Occupational History   Not on file  Tobacco Use   Smoking status: Every Day    Current packs/day: 0.50    Types: Cigarettes   Smokeless tobacco: Never  Vaping Use   Vaping status: Never Used  Substance and Sexual Activity   Alcohol use: Yes    Comment: occ   Drug use: Yes    Types: Marijuana   Sexual activity: Not on file  Other Topics Concern   Not on file  Social History Narrative   Not on file   Social Drivers of Health   Financial Resource Strain: Not on file  Food Insecurity: Not on file  Transportation Needs: Not on file  Physical Activity: Not on file  Stress: Not on file  Social Connections: Not on file  Intimate Partner Violence: Not on file    Family History  Problem Relation Age of Onset   Cancer Maternal Aunt    Breast cancer Maternal Aunt        40's   Gastric cancer Maternal Grandmother     No Known Allergies  Outpatient Medications Prior to Visit  Medication Sig   celecoxib  (CELEBREX ) 200 MG capsule Take 1 capsule (200 mg total) by mouth daily.   cyclobenzaprine  (  FLEXERIL ) 10 MG tablet Take 10 mg by mouth 3 (three) times daily as needed.   diclofenac Sodium (VOLTAREN) 1 % GEL Voltaren 1% External Gel QTY: 50 gram Days: 30 Refills: 2  Written: 06/30/21 Patient Instructions: apply bid to the area for 2 weks   EMGALITY 120 MG/ML SOAJ Inject into the skin.   fluticasone  (FLONASE ) 50 MCG/ACT nasal spray Place 1 spray into both nostrils daily.   glipiZIDE (GLUCOTROL XL) 2.5 MG 24 hr tablet TAKE ONE TABLET BY MOUTH ONE TIME DAILY   hydrochlorothiazide (MICROZIDE) 12.5 MG capsule TAKE ONE CAPSULE BY MOUTH ONE TIME DAILY IF BLOOD PRESSURE IS ELEVATED   loratadine (CLARITIN) 10 MG tablet Take by mouth.   losartan (COZAAR) 100 MG tablet TAKE ONE TABLET BY MOUTH ONE TIME DAILY   pantoprazole  (PROTONIX ) 40 MG tablet Take 1 tablet (40 mg total) by mouth 2 (two)  times daily before a meal.   SUMAtriptan (IMITREX) 100 MG tablet Take 100 mg by mouth as directed.   venlafaxine (EFFEXOR) 37.5 MG tablet Take 37.5 mg by mouth.   amoxicillin -clavulanate (AUGMENTIN ) 875-125 MG tablet Take 1 tablet by mouth 2 (two) times daily. (Patient not taking: Reported on 09/25/2023)   ASHWAGANDHA GUMMIES PO Take by mouth. (Patient not taking: Reported on 09/25/2023)   empagliflozin  (JARDIANCE ) 25 MG TABS tablet Take 1 tablet (25 mg total) by mouth daily before breakfast. (Patient not taking: Reported on 09/25/2023)   [DISCONTINUED] ALPRAZolam  (XANAX ) 0.25 MG tablet Take 1 tablet (0.25 mg total) by mouth 3 (three) times daily as needed for anxiety. (Patient not taking: Reported on 09/25/2023)   No facility-administered medications prior to visit.    Review of Systems  Constitutional: Negative.   HENT:  Positive for congestion, ear pain, sinus pain and sore throat.   Eyes: Negative.  Negative for blurred vision and double vision.  Respiratory: Negative.  Negative for cough and shortness of breath.   Cardiovascular: Negative.  Negative for chest pain, palpitations and leg swelling.  Gastrointestinal: Negative.  Negative for abdominal pain, constipation, diarrhea, heartburn, nausea and vomiting.  Genitourinary: Negative.  Negative for dysuria and flank pain.  Musculoskeletal: Negative.  Negative for joint pain and myalgias.  Skin: Negative.   Neurological: Negative.  Negative for dizziness and headaches.  Endo/Heme/Allergies: Negative.   Psychiatric/Behavioral: Negative.  Negative for depression and suicidal ideas. The patient is not nervous/anxious.        Objective:   BP 122/84   Pulse 89   Ht 5' 4.5 (1.638 m)   Wt 211 lb 3.2 oz (95.8 kg)   SpO2 98%   BMI 35.69 kg/m   Vitals:   09/25/23 1315  BP: 122/84  Pulse: 89  Height: 5' 4.5 (1.638 m)  Weight: 211 lb 3.2 oz (95.8 kg)  SpO2: 98%  BMI (Calculated): 35.71    Physical Exam Vitals and nursing note  reviewed.  Constitutional:      Appearance: Normal appearance.  HENT:     Head: Normocephalic and atraumatic.     Nose: Nose normal.     Mouth/Throat:     Mouth: Mucous membranes are moist.     Pharynx: Oropharynx is clear.  Eyes:     Conjunctiva/sclera: Conjunctivae normal.     Pupils: Pupils are equal, round, and reactive to light.  Cardiovascular:     Rate and Rhythm: Normal rate and regular rhythm.     Pulses: Normal pulses.     Heart sounds: Normal heart sounds. No murmur heard. Pulmonary:  Effort: Pulmonary effort is normal.     Breath sounds: Normal breath sounds. No wheezing.  Abdominal:     General: Bowel sounds are normal.     Palpations: Abdomen is soft.     Tenderness: There is no abdominal tenderness. There is no right CVA tenderness or left CVA tenderness.  Musculoskeletal:        General: Normal range of motion.     Cervical back: Normal range of motion.     Right lower leg: No edema.     Left lower leg: No edema.  Skin:    General: Skin is warm and dry.  Neurological:     General: No focal deficit present.     Mental Status: She is alert and oriented to person, place, and time.  Psychiatric:        Mood and Affect: Mood normal.        Behavior: Behavior normal.      No results found for any visits on 09/25/23.  Recent Results (from the past 2160 hours)  POCT Glucose (CBG)     Status: Abnormal   Collection Time: 08/20/23  2:59 PM  Result Value Ref Range   POC Glucose 157 (A) 70 - 99 mg/dl  RFE85+ZHQM     Status: Abnormal   Collection Time: 09/10/23 10:53 AM  Result Value Ref Range   Glucose 99 70 - 99 mg/dL   BUN 8 6 - 24 mg/dL   Creatinine, Ser 8.90 (H) 0.57 - 1.00 mg/dL   eGFR 60 >40 fO/fpw/8.26   BUN/Creatinine Ratio 7 (L) 9 - 23   Sodium 144 134 - 144 mmol/L   Potassium 4.2 3.5 - 5.2 mmol/L   Chloride 106 96 - 106 mmol/L   CO2 27 20 - 29 mmol/L   Calcium 9.3 8.7 - 10.2 mg/dL   Total Protein 6.3 6.0 - 8.5 g/dL   Albumin 4.1 3.8 - 4.9  g/dL   Globulin, Total 2.2 1.5 - 4.5 g/dL   Bilirubin Total 0.4 0.0 - 1.2 mg/dL   Alkaline Phosphatase 87 44 - 121 IU/L   AST 12 0 - 40 IU/L   ALT 16 0 - 32 IU/L  Lipid Panel w/o Chol/HDL Ratio     Status: None   Collection Time: 09/10/23 10:53 AM  Result Value Ref Range   Cholesterol, Total 135 100 - 199 mg/dL   Triglycerides 884 0 - 149 mg/dL   HDL 45 >60 mg/dL   VLDL Cholesterol Cal 21 5 - 40 mg/dL   LDL Chol Calc (NIH) 69 0 - 99 mg/dL  Hemoglobin J8r     Status: Abnormal   Collection Time: 09/10/23 10:53 AM  Result Value Ref Range   Hgb A1c MFr Bld 6.3 (H) 4.8 - 5.6 %    Comment:          Prediabetes: 5.7 - 6.4          Diabetes: >6.4          Glycemic control for adults with diabetes: <7.0    Est. average glucose Bld gHb Est-mCnc 134 mg/dL      Assessment & Plan:  Patient advised to continue all her medications.  Levaquin  for 7 days, along with her nasal spray. Voltaren gel and Flexeril  for her left shoulder and upper back pain.  Patient will contact her orthopedic surgeon also. Problem List Items Addressed This Visit     Dyslipidemia associated with type 2 diabetes mellitus (HCC)   OSA (obstructive sleep  apnea)   GAD (generalized anxiety disorder)   Hypertension associated with diabetes (HCC) - Primary   Other Visit Diagnoses       Acute recurrent frontal sinusitis       Relevant Medications   levofloxacin  (LEVAQUIN ) 500 MG tablet       Return in about 2 months (around 11/23/2023).   Total time spent: 30 minutes  FERNAND FREDY RAMAN, MD  09/25/2023   This document may have been prepared by [facility] Voice Recognition software and as such may include unintentional dictation errors.

## 2023-09-28 ENCOUNTER — Other Ambulatory Visit: Payer: Self-pay | Admitting: Family

## 2023-10-29 ENCOUNTER — Ambulatory Visit: Payer: Managed Care, Other (non HMO) | Admitting: Internal Medicine

## 2023-11-06 ENCOUNTER — Other Ambulatory Visit: Payer: Self-pay | Admitting: Family

## 2023-11-10 ENCOUNTER — Other Ambulatory Visit: Payer: Self-pay | Admitting: Gastroenterology

## 2023-11-23 ENCOUNTER — Encounter: Payer: Self-pay | Admitting: Internal Medicine

## 2023-11-23 ENCOUNTER — Ambulatory Visit: Payer: Managed Care, Other (non HMO) | Admitting: Internal Medicine

## 2023-11-23 VITALS — BP 112/84 | HR 71 | Ht 64.0 in | Wt 205.6 lb

## 2023-11-23 DIAGNOSIS — G4733 Obstructive sleep apnea (adult) (pediatric): Secondary | ICD-10-CM

## 2023-11-23 DIAGNOSIS — F411 Generalized anxiety disorder: Secondary | ICD-10-CM

## 2023-11-23 DIAGNOSIS — E785 Hyperlipidemia, unspecified: Secondary | ICD-10-CM

## 2023-11-23 DIAGNOSIS — E1169 Type 2 diabetes mellitus with other specified complication: Secondary | ICD-10-CM

## 2023-11-23 DIAGNOSIS — E119 Type 2 diabetes mellitus without complications: Secondary | ICD-10-CM

## 2023-11-23 DIAGNOSIS — E1159 Type 2 diabetes mellitus with other circulatory complications: Secondary | ICD-10-CM | POA: Diagnosis not present

## 2023-11-23 DIAGNOSIS — I152 Hypertension secondary to endocrine disorders: Secondary | ICD-10-CM

## 2023-11-23 DIAGNOSIS — G43009 Migraine without aura, not intractable, without status migrainosus: Secondary | ICD-10-CM

## 2023-11-23 DIAGNOSIS — J301 Allergic rhinitis due to pollen: Secondary | ICD-10-CM

## 2023-11-23 NOTE — Progress Notes (Signed)
 Established Patient Office Visit  Subjective:  Patient ID: Penny Myers, female    DOB: [birth year]  Age: 53 y.o. MRN: [id number]  Chief Complaint  Patient presents with   Follow-up    1 month follow up    Patient comes in for her follow-up visit.  She continues to have joint pains and will be seen by her orthopedic.  May need some physical therapy as well.  She is now taking Jardiance as well.  She is also controlling her diet and has been able to lose a few pounds.  Her labs are not due until next month.    No other concerns at this time.   Past Medical History:  Diagnosis Date   Acid reflux    Anxiety    Atypical chest pain 07/02/2015   Note: Unchanged Note: Unchanged   Family history of malignant neoplasm 09/28/2015   Note: Unchanged   Heart murmur    Hypertension    States that she does not have HBP, but takes losartan for leaking valve.   Intussusception of intestine [facility])     Past Surgical History:  Procedure Laterality Date   APPENDECTOMY     CHOLECYSTECTOMY     SHOULDER ARTHROSCOPY WITH SUBACROMIAL DECOMPRESSION Left 12/09/2015   Procedure: SHOULDER ARTHROSCOPY WITH SUBACROMIAL DECOMPRESSION;  Surgeon: Loreta Ave, MD;  Location: [facility];  Service: Orthopedics;  Laterality: Left;   TUBAL LIGATION      Social History   Socioeconomic History   Marital status: Married    Spouse name: Not on file   Number of children: Not on file   Years of education: Not on file   Highest education level: Not on file  Occupational History   Not on file  Tobacco Use   Smoking status: Every Day    Current packs/day: 0.50    Types: Cigarettes   Smokeless tobacco: Never  Vaping Use   Vaping status: Never Used  Substance and Sexual Activity   Alcohol use: Yes    Comment: occ   Drug use: Yes    Types: Marijuana   Sexual activity: Not on file  Other Topics Concern   Not on file  Social History Narrative   Not on file   Social Drivers of  Health   Financial Resource Strain: Not on file  Food Insecurity: Not on file  Transportation Needs: Not on file  Physical Activity: Not on file  Stress: Not on file  Social Connections: Not on file  Intimate Partner Violence: Not on file    Family History  Problem Relation Age of Onset   Cancer Maternal Aunt    Breast cancer Maternal Aunt        40's   Gastric cancer Maternal Grandmother     No Known Allergies  Outpatient Medications Prior to Visit  Medication Sig   celecoxib (CELEBREX) 200 MG capsule Take 1 capsule (200 mg total) by mouth daily.   cyclobenzaprine (FLEXERIL) 10 MG tablet Take 10 mg by mouth 3 (three) times daily as needed.   diclofenac Sodium (VOLTAREN) 1 % GEL Voltaren 1% External Gel QTY: 50 gram Days: 30 Refills: 2  Written: 06/30/21 Patient Instructions: apply bid to the area for 2 weks   EMGALITY 120 MG/ML SOAJ Inject into the skin.   empagliflozin (JARDIANCE) 25 MG TABS tablet Take 1 tablet (25 mg total) by mouth daily before breakfast.   fluticasone (FLONASE) 50 MCG/ACT nasal spray Place 1 spray into both nostrils  daily.   hydrochlorothiazide (MICROZIDE) 12.5 MG capsule TAKE ONE CAPSULE BY MOUTH ONE TIME DAILY IF BLOOD PRESSURE IS ELEVATED   loratadine (CLARITIN) 10 MG tablet Take by mouth.   losartan (COZAAR) 100 MG tablet TAKE ONE TABLET BY MOUTH ONE TIME DAILY   pantoprazole (PROTONIX) 40 MG tablet TAKE ONE TABLET BY MOUTH TWICE A DAY BEFORE A MEAL   venlafaxine (EFFEXOR) 37.5 MG tablet Take 37.5 mg by mouth.   amoxicillin-clavulanate (AUGMENTIN) 875-125 MG tablet Take 1 tablet by mouth 2 (two) times daily. (Patient not taking: Reported on 11/23/2023)   ASHWAGANDHA GUMMIES PO Take by mouth. (Patient not taking: Reported on 11/23/2023)   glipiZIDE (GLUCOTROL XL) 2.5 MG 24 hr tablet TAKE ONE TABLET BY MOUTH ONE TIME DAILY (Patient not taking: Reported on 11/23/2023)   levofloxacin (LEVAQUIN) 500 MG tablet Take 1 tablet (500 mg total) by mouth daily. (Patient  not taking: Reported on 11/23/2023)   SUMAtriptan (IMITREX) 100 MG tablet Take 100 mg by mouth as directed. (Patient not taking: Reported on 11/23/2023)   No facility-administered medications prior to visit.    Review of Systems  Constitutional: Negative.  Negative for chills, fever and weight loss.  HENT: Negative.  Negative for sore throat.   Eyes: Negative.   Respiratory: Negative.  Negative for cough and shortness of breath.   Cardiovascular: Negative.  Negative for chest pain, palpitations and leg swelling.  Gastrointestinal: Negative.  Negative for abdominal pain, constipation, diarrhea, heartburn, nausea and vomiting.  Genitourinary: Negative.  Negative for dysuria and flank pain.  Musculoskeletal: Negative.  Negative for joint pain and myalgias.  Skin: Negative.   Neurological: Negative.  Negative for dizziness and headaches.  Endo/Heme/Allergies: Negative.   Psychiatric/Behavioral: Negative.  Negative for depression and suicidal ideas. The patient is not nervous/anxious.        Objective:   BP 112/84   Pulse 71   Ht 5\' 4"  (1.626 m)   Wt 205 lb 9.6 oz (93.3 kg)   SpO2 98%   BMI 35.29 kg/m   Vitals:   11/23/23 1447  BP: 112/84  Pulse: 71  Height: 5\' 4"  (1.626 m)  Weight: 205 lb 9.6 oz (93.3 kg)  SpO2: 98%  BMI (Calculated): 35.27    Physical Exam Vitals and nursing note reviewed.  Constitutional:      Appearance: Normal appearance.  HENT:     Head: Normocephalic and atraumatic.     Nose: Nose normal.     Mouth/Throat:     Mouth: Mucous membranes are moist.     Pharynx: Oropharynx is clear.  Eyes:     Conjunctiva/sclera: Conjunctivae normal.     Pupils: Pupils are equal, round, and reactive to light.  Cardiovascular:     Rate and Rhythm: Normal rate and regular rhythm.     Pulses: Normal pulses.     Heart sounds: Normal heart sounds. No murmur heard. Pulmonary:     Effort: Pulmonary effort is normal.     Breath sounds: Normal breath sounds. No wheezing.   Abdominal:     General: Bowel sounds are normal.     Palpations: Abdomen is soft.     Tenderness: There is no abdominal tenderness. There is no right CVA tenderness or left CVA tenderness.  Musculoskeletal:        General: Normal range of motion.     Cervical back: Normal range of motion.     Right lower leg: No edema.     Left lower leg: No edema.  Skin:    General: Skin is warm and dry.  Neurological:     General: No focal deficit present.     Mental Status: She is alert and oriented to person, place, and time.  Psychiatric:        Mood and Affect: Mood normal.        Behavior: Behavior normal.      No results found for any visits on 11/23/23.  Recent Results (from the past 2160 hours)  CMP14+EGFR     Status: Abnormal   Collection Time: 09/10/23 10:53 AM  Result Value Ref Range   Glucose 99 70 - 99 mg/dL   BUN 8 6 - 24 mg/dL   Creatinine, Ser 2.95 (H) 0.57 - 1.00 mg/dL   eGFR 60 >62 ZH/YQM/5.78   BUN/Creatinine Ratio 7 (L) 9 - 23   Sodium 144 134 - 144 mmol/L   Potassium 4.2 3.5 - 5.2 mmol/L   Chloride 106 96 - 106 mmol/L   CO2 27 20 - 29 mmol/L   Calcium 9.3 8.7 - 10.2 mg/dL   Total Protein 6.3 6.0 - 8.5 g/dL   Albumin 4.1 3.8 - 4.9 g/dL   Globulin, Total 2.2 1.5 - 4.5 g/dL   Bilirubin Total 0.4 0.0 - 1.2 mg/dL   Alkaline Phosphatase 87 44 - 121 IU/L   AST 12 0 - 40 IU/L   ALT 16 0 - 32 IU/L  Lipid Panel w/o Chol/HDL Ratio     Status: None   Collection Time: 09/10/23 10:53 AM  Result Value Ref Range   Cholesterol, Total 135 100 - 199 mg/dL   Triglycerides 469 0 - 149 mg/dL   HDL 45 >62 mg/dL   VLDL Cholesterol Cal 21 5 - 40 mg/dL   LDL Chol Calc (NIH) 69 0 - 99 mg/dL  Hemoglobin X5M     Status: Abnormal   Collection Time: 09/10/23 10:53 AM  Result Value Ref Range   Hgb A1c MFr Bld 6.3 (H) 4.8 - 5.6 %    Comment:          Prediabetes: 5.7 - 6.4          Diabetes: >6.4          Glycemic control for adults with diabetes: <7.0    Est. average glucose Bld  gHb Est-mCnc 134 mg/dL      Assessment & Plan:  Continue current medications and a strict diet control.  Check labs in 1 month. Will need Pap at next follow-up. Problem List Items Addressed This Visit     Allergic rhinitis   Dyslipidemia associated with type 2 diabetes mellitus (HCC)   Relevant Orders   Lipid Panel w/o Chol/HDL Ratio   OSA (obstructive sleep apnea)   GAD (generalized anxiety disorder)   Migraine without aura and without status migrainosus, not intractable   Hypertension associated with diabetes (HCC) - Primary   Relevant Orders   CMP14+EGFR   Other Visit Diagnoses       Type 2 diabetes mellitus without complication, without long-term current use of insulin (HCC)       Relevant Orders   Hemoglobin A1c       Return in about 4 months (around 03/24/2024).   Total time spent: 30 minutes  Margaretann Loveless, MD  11/23/2023   This document may have been prepared by [facility] Voice Recognition software and as such may include unintentional dictation errors.

## 2023-12-10 ENCOUNTER — Telehealth: Payer: Self-pay

## 2023-12-10 ENCOUNTER — Other Ambulatory Visit

## 2023-12-10 ENCOUNTER — Ambulatory Visit: Payer: Managed Care, Other (non HMO) | Admitting: Gastroenterology

## 2023-12-10 DIAGNOSIS — I152 Hypertension secondary to endocrine disorders: Secondary | ICD-10-CM

## 2023-12-10 DIAGNOSIS — E119 Type 2 diabetes mellitus without complications: Secondary | ICD-10-CM

## 2023-12-10 DIAGNOSIS — E1169 Type 2 diabetes mellitus with other specified complication: Secondary | ICD-10-CM

## 2023-12-10 NOTE — Telephone Encounter (Signed)
 Per pt phone call forgot she had appt today. Pt wanted to know if anyone had canceled so she could come in later but we had no cancellation. I explained to pt I could reschedule her for May. Pt states she doe not want to be r/s in MAY that she thinks she will find a new MD. Pt states she can't wait until may.

## 2023-12-11 LAB — LIPID PANEL W/O CHOL/HDL RATIO
Cholesterol, Total: 141 mg/dL (ref 100–199)
HDL: 53 mg/dL (ref >39)
LDL Chol Calc (NIH): 69 mg/dL (ref 0–99)
Triglycerides: 101 mg/dL (ref 0–149)
VLDL Cholesterol Cal: 19 mg/dL (ref 5–40)

## 2023-12-11 LAB — CMP14+EGFR
ALT: 17 IU/L (ref 0–32)
AST: 11 IU/L (ref 0–40)
Albumin: 4.1 g/dL (ref 3.8–4.9)
Alkaline Phosphatase: 81 IU/L (ref 44–121)
BUN/Creatinine Ratio: 17 (ref 9–23)
BUN: 19 mg/dL (ref 6–24)
Bilirubin Total: 0.3 mg/dL (ref 0.0–1.2)
CO2: 23 mmol/L (ref 20–29)
Calcium: 9.3 mg/dL (ref 8.7–10.2)
Chloride: 106 mmol/L (ref 96–106)
Creatinine, Ser: 1.09 mg/dL — ABNORMAL HIGH (ref 0.57–1.00)
Globulin, Total: 2.1 g/dL (ref 1.5–4.5)
Glucose: 89 mg/dL (ref 70–99)
Potassium: 4.1 mmol/L (ref 3.5–5.2)
Sodium: 142 mmol/L (ref 134–144)
Total Protein: 6.2 g/dL (ref 6.0–8.5)
eGFR: 60 mL/min/{1.73_m2} (ref >59)

## 2023-12-11 LAB — HEMOGLOBIN A1C
Est. average glucose Bld gHb Est-mCnc: 134 mg/dL
Hgb A1c MFr Bld: 6.3 % — ABNORMAL HIGH (ref 4.8–5.6)

## 2023-12-11 NOTE — Progress Notes (Signed)
 Patient notified

## 2023-12-31 ENCOUNTER — Other Ambulatory Visit: Payer: Self-pay | Admitting: Internal Medicine

## 2023-12-31 ENCOUNTER — Telehealth: Payer: Self-pay

## 2023-12-31 DIAGNOSIS — E119 Type 2 diabetes mellitus without complications: Secondary | ICD-10-CM

## 2023-12-31 NOTE — Telephone Encounter (Signed)
 Per pt phone call she is taken two pills (PANTOPRAZOLE)a day as she was instructed. Pt states it's not helping her at all ,she feels like food is getting stuck in her throat. It also has a burning feeling. Pt wants to know if she can get an EGD  scheduled before seeing Md on 01-29-2024. Pt miss appt in March . Pt would like a return call to [phone]

## 2023-12-31 NOTE — Telephone Encounter (Signed)
 Patient is not schedule for a EGD the 01/29/2024 was just a follow up appointment. Do you want to schedule patient for a EGD she has not been seen since 06/05/2023

## 2023-12-31 NOTE — Telephone Encounter (Signed)
 Switch to omeprazole 40 mg twice daily before meals for 1 month See if we can move her EGD sooner than scheduled  RV

## 2023-12-31 NOTE — Telephone Encounter (Signed)
 Tried to call patient someone answered and then disconnected the phone. Will try again at a later time

## 2023-12-31 NOTE — Telephone Encounter (Signed)
 Go ahead and schedule EGD Dysphagia, chronic GERD  RV

## 2024-01-01 ENCOUNTER — Other Ambulatory Visit: Payer: Self-pay

## 2024-01-01 ENCOUNTER — Encounter: Payer: Self-pay | Admitting: Gastroenterology

## 2024-01-01 DIAGNOSIS — K219 Gastro-esophageal reflux disease without esophagitis: Secondary | ICD-10-CM

## 2024-01-01 DIAGNOSIS — R1310 Dysphagia, unspecified: Secondary | ICD-10-CM

## 2024-01-01 MED ORDER — OMEPRAZOLE 40 MG PO CPDR: 40.0000 mg | DELAYED_RELEASE_CAPSULE | Freq: Two times a day (BID) | ORAL | 0 refills | Status: DC

## 2024-01-01 NOTE — Telephone Encounter (Signed)
 Called patient and patient verbalized understanding she will stop the Pantoprazole and start the omeprazole. Sent omeprazole to the pharmacy. Schedule EGD for 01/08/2024 in [place]. Went over instructions sent them to mychart

## 2024-01-01 NOTE — Addendum Note (Signed)
 Addended by: Conny Del L on: 01/01/2024 09:26 AM   Modules accepted: Orders

## 2024-01-01 NOTE — Telephone Encounter (Signed)
 The patient called back to speak to [place].

## 2024-01-01 NOTE — Telephone Encounter (Signed)
 Called and left a message for call back

## 2024-01-07 NOTE — Anesthesia Preprocedure Evaluation (Addendum)
 Anesthesia Evaluation  Patient identified by MRN, date of birth, ID band Patient awake    Reviewed: Allergy & Precautions, H&P , NPO status , Patient's Chart, lab work & pertinent test results  Airway Mallampati: III  TM Distance: >3 FB Neck ROM: Full    Dental no notable dental hx.    Pulmonary neg pulmonary ROS, sleep apnea , Current Smoker   Pulmonary exam normal breath sounds clear to auscultation       Cardiovascular hypertension, negative cardio ROS Normal cardiovascular exam+ Valvular Problems/Murmurs  Rhythm:Regular Rate:Normal  Hx heart murmur listed on Epic, I do not see any record of echo or cardiac note on Epic. Also checked "care everywhere"   Neuro/Psych  Headaches PSYCHIATRIC DISORDERS Anxiety     negative neurological ROS  negative psych ROS   GI/Hepatic negative GI ROS, Neg liver ROS,GERD  ,,  Endo/Other  negative endocrine ROSdiabetes    Renal/GU negative Renal ROS  negative genitourinary   Musculoskeletal negative musculoskeletal ROS (+)    Abdominal   Peds negative pediatric ROS (+)  Hematology negative hematology ROS (+) Blood dyscrasia, anemia   Anesthesia Other Findings Acid reflux  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HCC) Hypertension  Heart murmur Anxiety  Family history of malignant neoplasm Atypical chest pain  Sleep apnea Anemia  Migraine headache Type 2 diabetes mellitus (HCC) Wears contact lenses    Reproductive/Obstetrics negative OB ROS                              Anesthesia Physical Anesthesia Plan  ASA: 2  Anesthesia Plan: General   Post-op Pain Management:    Induction: Intravenous  PONV Risk Score and Plan:   Airway Management Planned: Natural Airway and Nasal Cannula  Additional Equipment:   Intra-op Plan:   Post-operative Plan:   Informed Consent: I have reviewed the patients History and Physical, chart, labs and discussed the  procedure including the risks, benefits and alternatives for the proposed anesthesia with the patient or authorized representative who has indicated his/her understanding and acceptance.     Dental Advisory Given  Plan Discussed with: Anesthesiologist, CRNA and Surgeon  Anesthesia Plan Comments: (Patient consented for risks of anesthesia including but not limited to:  - adverse reactions to medications - risk of airway placement if required - damage to eyes, teeth, lips or other oral mucosa - nerve damage due to positioning  - sore throat or hoarseness - Damage to heart, brain, nerves, lungs, other parts of body or loss of life  Patient voiced understanding and assent.)         Anesthesia Quick Evaluation

## 2024-01-08 ENCOUNTER — Encounter
Admission: RE | Disposition: A | Discharge status: Home / Self Care | Payer: Self-pay | Source: Home / Self Care | Attending: Gastroenterology

## 2024-01-08 ENCOUNTER — Ambulatory Visit
Admission: RE | Admit: 2024-01-08 | Discharge: 2024-01-08 | Disposition: A | Discharge status: Home / Self Care | Attending: Gastroenterology | Admitting: Gastroenterology

## 2024-01-08 ENCOUNTER — Other Ambulatory Visit: Payer: Self-pay

## 2024-01-08 ENCOUNTER — Encounter: Payer: Self-pay | Admitting: Gastroenterology

## 2024-01-08 ENCOUNTER — Ambulatory Visit: Payer: Self-pay | Admitting: Anesthesiology

## 2024-01-08 DIAGNOSIS — R1319 Other dysphagia: Secondary | ICD-10-CM | POA: Insufficient documentation

## 2024-01-08 DIAGNOSIS — R12 Heartburn: Secondary | ICD-10-CM | POA: Insufficient documentation

## 2024-01-08 DIAGNOSIS — R1310 Dysphagia, unspecified: Secondary | ICD-10-CM | POA: Diagnosis not present

## 2024-01-08 DIAGNOSIS — F17200 Nicotine dependence, unspecified, uncomplicated: Secondary | ICD-10-CM | POA: Insufficient documentation

## 2024-01-08 DIAGNOSIS — I10 Essential (primary) hypertension: Secondary | ICD-10-CM | POA: Diagnosis not present

## 2024-01-08 DIAGNOSIS — K219 Gastro-esophageal reflux disease without esophagitis: Secondary | ICD-10-CM

## 2024-01-08 DIAGNOSIS — E119 Type 2 diabetes mellitus without complications: Secondary | ICD-10-CM | POA: Diagnosis not present

## 2024-01-08 DIAGNOSIS — Z7984 Long term (current) use of oral hypoglycemic drugs: Secondary | ICD-10-CM | POA: Diagnosis not present

## 2024-01-08 DIAGNOSIS — G4730 Sleep apnea, unspecified: Secondary | ICD-10-CM | POA: Insufficient documentation

## 2024-01-08 HISTORY — PX: ESOPHAGOGASTRODUODENOSCOPY: SHX5428

## 2024-01-08 HISTORY — DX: Type 2 diabetes mellitus without complications: E11.9

## 2024-01-08 HISTORY — DX: Migraine, unspecified, not intractable, without status migrainosus: G43.909

## 2024-01-08 HISTORY — DX: Sleep apnea, unspecified: G47.30

## 2024-01-08 HISTORY — DX: Anemia, unspecified: D64.9

## 2024-01-08 HISTORY — DX: Presence of spectacles and contact lenses: Z97.3

## 2024-01-08 LAB — GLUCOSE, CAPILLARY: Glucose-Capillary: 106 mg/dL — ABNORMAL HIGH (ref 70–99)

## 2024-01-08 LAB — POCT PREGNANCY, URINE: Preg Test, Ur: NEGATIVE

## 2024-01-08 SURGERY — EGD (ESOPHAGOGASTRODUODENOSCOPY)
Anesthesia: General

## 2024-01-08 MED ORDER — SODIUM CHLORIDE 0.9 % IV SOLN: INTRAVENOUS | Status: DC

## 2024-01-08 MED ORDER — PROPOFOL 10 MG/ML IV BOLUS
INTRAVENOUS | Status: DC | PRN
  Administered 2024-01-08: 100 mg via INTRAVENOUS

## 2024-01-08 MED ORDER — PROPOFOL 10 MG/ML IV BOLUS
INTRAVENOUS | Status: AC
  Filled 2024-01-08: qty 20

## 2024-01-08 MED ORDER — LIDOCAINE 2% (20 MG/ML) 5 ML SYRINGE
INTRAMUSCULAR | Status: DC | PRN
  Administered 2024-01-08: 100 mg via INTRAVENOUS

## 2024-01-08 SURGICAL SUPPLY — 18 items
BALLN DILATOR ESOPH 8 10 CRE (MISCELLANEOUS) IMPLANT
BALLOON DILATOR 12-15 8 (BALLOONS) IMPLANT
BALLOON DILATOR 15-18 8 (BALLOONS) IMPLANT
BALLOON DILATOR CRE 0-12 8 (BALLOONS) IMPLANT
BLOCK BITE 60FR ADLT L/F GRN (MISCELLANEOUS) ×1 IMPLANT
CLIP HMST 235XBRD CATH ROT (MISCELLANEOUS) IMPLANT
ELECTRODE REM PT RTRN 9FT ADLT (ELECTROSURGICAL) IMPLANT
FORCEPS BIOP RAD 4 LRG CAP 4 (CUTTING FORCEPS) IMPLANT
GOWN CVR UNV OPN BCK APRN NK (MISCELLANEOUS) ×2 IMPLANT
INJECTOR VARIJECT VIN23 (MISCELLANEOUS) IMPLANT
KIT DEFENDO VALVE AND CONN (KITS) IMPLANT
KIT PRC NS LF DISP ENDO (KITS) ×1 IMPLANT
MANIFOLD NEPTUNE II (INSTRUMENTS) ×1 IMPLANT
MARKER SPOT ENDO TATTOO 5ML (MISCELLANEOUS) IMPLANT
RETRIEVER NET PLAT FOOD (MISCELLANEOUS) IMPLANT
SYR INFLATION 60ML (SYRINGE) IMPLANT
WATER STERILE IRR 250ML POUR (IV SOLUTION) ×1 IMPLANT
WIRE CRE 18-20MM 8CM F G (MISCELLANEOUS) IMPLANT

## 2024-01-08 NOTE — Transfer of Care (Signed)
 Immediate Anesthesia Transfer of Care Note  Patient: Penny Myers  Procedure(s) Performed: EGD (ESOPHAGOGASTRODUODENOSCOPY)  Patient Location: PACU  Anesthesia Type: General  Level of Consciousness: awake, alert  and patient cooperative  Airway and Oxygen Therapy: Patient Spontanous Breathing and Patient connected to supplemental oxygen  Post-op Assessment: Post-op Vital signs reviewed, Patient's Cardiovascular Status Stable, Respiratory Function Stable, Patent Airway and No signs of Nausea or vomiting  Post-op Vital Signs: Reviewed and stable  Complications: No notable events documented.

## 2024-01-08 NOTE — Op Note (Signed)
 [facility] Gastroenterology Patient Name: Penny Myers Procedure Date: 01/08/2024 10:21 AM MRN: [id number] Account #: [id number] Date of Birth: Oct 25, 1970 Admit Type: Outpatient Age: 53 Room: [facility] OR ROOM 01 Gender: Female Note Status: Finalized Instrument Name: [id number] Procedure:             Upper GI endoscopy Indications:           Esophageal dysphagia, Heartburn Providers:             Selena Daily MD, MD Referring MD:          Aisha Hove, MD (Referring MD) Medicines:             General Anesthesia Complications:         No immediate complications. Estimated blood loss: None. Procedure:             Pre-Anesthesia Assessment:                        - Prior to the procedure, a History and Physical was                         performed, and patient medications and allergies were                         reviewed. The patient is competent. The risks and                         benefits of the procedure and the sedation options and                         risks were discussed with the patient. All questions                         were answered and informed consent was obtained.                         Patient identification and proposed procedure were                         verified by the physician, the nurse, the                         anesthesiologist, the anesthetist and the technician                         in the pre-procedure area in the procedure room in the                         endoscopy suite. Mental Status Examination: alert and                         oriented. Airway Examination: normal oropharyngeal                         airway and neck mobility. Respiratory Examination:                         clear to auscultation. CV Examination: normal.  Prophylactic Antibiotics: The patient does not require                         prophylactic antibiotics. Prior Anticoagulants: The                         patient has taken  no anticoagulant or antiplatelet                         agents. ASA Grade Assessment: II - A patient with mild                         systemic disease. After reviewing the risks and                         benefits, the patient was deemed in satisfactory                         condition to undergo the procedure. The anesthesia                         plan was to use general anesthesia. Immediately prior                         to administration of medications, the patient was                         re-assessed for adequacy to receive sedatives. The                         heart rate, respiratory rate, oxygen saturations,                         blood pressure, adequacy of pulmonary ventilation, and                         response to care were monitored throughout the                         procedure. The physical status of the patient was                         re-assessed after the procedure.                        After obtaining informed consent, the endoscope was                         passed under direct vision. Throughout the procedure,                         the patient's blood pressure, pulse, and oxygen                         saturations were monitored continuously. The Endoscope                         was introduced through the mouth, and advanced to the  second part of duodenum. The upper GI endoscopy was                         accomplished without difficulty. The patient tolerated                         the procedure well. Findings:      The esophagus was normal.      The stomach was normal.      The examined duodenum was normal. Impression:            - Normal esophagus.                        - Normal stomach.                        - Normal examined duodenum.                        - No specimens collected. Recommendation:        - Discharge patient to home (with escort).                        - Resume previous diet today.                         - Continue present medications.                        - Follow an antireflux regimen indefinitely. Procedure Code(s):     --- Professional ---                        [phone], Esophagogastroduodenoscopy, flexible,                         transoral; diagnostic, including collection of                         specimen(s) by brushing or washing, when performed                         (separate procedure) Diagnosis Code(s):     --- Professional ---                        R13.14, Dysphagia, pharyngoesophageal phase                        R12, Heartburn CPT copyright 2022 American Medical Association. All rights reserved. The codes documented in this report are preliminary and upon coder review may  be revised to meet current compliance requirements. Dr. Evia Hof Selena Daily MD, MD 01/08/2024 10:38:13 AM This report has been signed electronically. Number of Addenda: 0 Note Initiated On: 01/08/2024 10:21 AM Total Procedure Duration: 0 hours 3 minutes 27 seconds  Estimated Blood Loss:  Estimated blood loss: none.      [facility]

## 2024-01-08 NOTE — Anesthesia Postprocedure Evaluation (Signed)
 Anesthesia Post Note  Patient: Penny Myers  Procedure(s) Performed: EGD (ESOPHAGOGASTRODUODENOSCOPY)  Patient location during evaluation: PACU Anesthesia Type: General Level of consciousness: awake and alert Pain management: pain level controlled Vital Signs Assessment: post-procedure vital signs reviewed and stable Respiratory status: spontaneous breathing, nonlabored ventilation, respiratory function stable and patient connected to nasal cannula oxygen Cardiovascular status: blood pressure returned to baseline and stable Postop Assessment: no apparent nausea or vomiting Anesthetic complications: no   No notable events documented.   Last Vitals:  Vitals:   01/08/24 1045 01/08/24 1057  BP: (!) 89/55 105/76  Pulse: 75 77  Resp: 16 13  Temp:  (!) 36.2 C  SpO2: 93% 99%    Last Pain:  Vitals:   01/08/24 1057  TempSrc:   PainSc: 0-No pain                 Jace Fermin C Lamya Lausch

## 2024-01-08 NOTE — H&P (Signed)
 Karma Oz, MD [address]  [address]  [place], [place] [address]  Main: [phone]  Fax: [phone] Pager: [phone]  Primary Care Physician:  Aisha Hove, MD Primary Gastroenterologist:  Dr. Karma Oz  Pre-Procedure History & Physical: HPI:  Penny Myers is a 53 y.o. female is here for an endoscopy.   Past Medical History:  Diagnosis Date   Acid reflux    Anemia    Anxiety    Atypical chest pain 07/02/2015   Note: Unchanged Note: Unchanged   Family history of malignant neoplasm 09/28/2015   Note: Unchanged   Heart murmur    Hypertension    States that she does not have HBP, but takes losartan for leaking valve.   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HCC)    Migraine headache    Sleep apnea    APAP   Type 2 diabetes mellitus (HCC)    Wears contact lenses     Past Surgical History:  Procedure Laterality Date   APPENDECTOMY     CHOLECYSTECTOMY     SHOULDER ARTHROSCOPY WITH SUBACROMIAL DECOMPRESSION Left 12/09/2015   Procedure: SHOULDER ARTHROSCOPY WITH SUBACROMIAL DECOMPRESSION;  Surgeon: Ferd Householder, MD;  Location: [facility];  Service: Orthopedics;  Laterality: Left;   TUBAL LIGATION      Prior to Admission medications   Medication Sig Start Date End Date Taking? Authorizing Provider  celecoxib  (CELEBREX ) 200 MG capsule Take 1 capsule (200 mg total) by mouth daily. 08/20/23 08/19/24 Yes Aisha Hove, MD  cyclobenzaprine  (FLEXERIL ) 10 MG tablet Take 10 mg by mouth 3 (three) times daily as needed. 05/25/23  Yes [provider]  diclofenac Sodium (VOLTAREN) 1 % GEL Voltaren 1% External Gel QTY: 50 gram Days: 30 Refills: 2  Written: 06/30/21 Patient Instructions: apply bid to the area for 2 weks 06/30/21  Yes [provider]  EMGALITY 120 MG/ML SOAJ Inject into the skin. 03/23/23  Yes [provider]  fluticasone  (FLONASE ) 50 MCG/ACT nasal spray Place 1 spray into both nostrils daily. 08/20/23  Yes Aisha Hove, MD  hydrochlorothiazide (MICROZIDE) 12.5 MG capsule TAKE ONE CAPSULE BY MOUTH ONE TIME DAILY IF BLOOD PRESSURE IS ELEVATED 11/07/23  Yes Trenda Frisk, FNP  JARDIANCE  25 MG TABS tablet TAKE ONE TABLET BY MOUTH ONE TIME DAILY BEFORE BREAKFAST 12/31/23  Yes Aisha Hove, MD  loratadine (CLARITIN) 10 MG tablet Take by mouth.   Yes [provider]  losartan (COZAAR) 100 MG tablet TAKE ONE TABLET BY MOUTH ONE TIME DAILY 09/28/23  Yes Trenda Frisk, FNP  omeprazole  (PRILOSEC) 40 MG capsule Take 1 capsule (40 mg total) by mouth 2 (two) times daily. 01/01/24 01/31/24 Yes Jenalyn Girdner, Elson Halon, MD  venlafaxine (EFFEXOR) 37.5 MG tablet Take 37.5 mg by mouth. 05/25/23  Yes [provider]  glipiZIDE (GLUCOTROL XL) 2.5 MG 24 hr tablet TAKE ONE TABLET BY MOUTH ONE TIME DAILY Patient not taking: Reported on 11/23/2023 01/29/23   Aisha Hove, MD  SUMAtriptan (IMITREX) 100 MG tablet Take 100 mg by mouth as directed. Patient not taking: Reported on 11/23/2023 03/01/23   [provider]    Allergies as of 01/01/2024   (No Known Allergies)    Family History  Problem Relation Age of Onset   Cancer Maternal Aunt    Breast cancer Maternal Aunt        40's   Gastric cancer Maternal Grandmother     Social History   Socioeconomic  History   Marital status: Married    Spouse name: Not on file   Number of children: Not on file   Years of education: Not on file   Highest education level: Not on file  Occupational History   Not on file  Tobacco Use   Smoking status: Every Day    Current packs/day: 0.30    Average packs/day: 0.3 packs/day for 36.3 years (10.9 ttl pk-yrs)    Types: Cigarettes    Start date: 1989   Smokeless tobacco: Never  Vaping Use   Vaping status: Never Used  Substance and Sexual Activity   Alcohol use: Yes    Comment: occ   Drug use: Yes    Types: Marijuana   Sexual activity: Not on file  Other Topics Concern   Not on file  Social History Narrative    Not on file   Social Drivers of Health   Financial Resource Strain: Not on file  Food Insecurity: Not on file  Transportation Needs: Not on file  Physical Activity: Not on file  Stress: Not on file  Social Connections: Not on file  Intimate Partner Violence: Not on file    Review of Systems: See HPI, otherwise negative ROS  Physical Exam: BP 117/81   Pulse 70   Temp 98 F (36.7 C) (Temporal)   Resp 18   Ht 5\' 4"  (1.626 m)   Wt 91.4 kg   SpO2 98%   BMI 34.60 kg/m  General:   Alert,  pleasant and cooperative in NAD Head:  Normocephalic and atraumatic. Neck:  Supple; no masses or thyromegaly. Lungs:  Clear throughout to auscultation.    Heart:  Regular rate and rhythm. Abdomen:  Soft, nontender and nondistended. Normal bowel sounds, without guarding, and without rebound.   Neurologic:  Alert and  oriented x4;  grossly normal neurologically.  Impression/Plan: [name] is here for an endoscopy to be performed for dysphagia, heart burn  Risks, benefits, limitations, and alternatives regarding  endoscopy have been reviewed with the patient.  Questions have been answered.  All parties agreeable.   Ellis Guys, MD  01/08/2024, 10:14 AM

## 2024-01-11 NOTE — Progress Notes (Unsigned)
 [facility] [address] [place], [place] [address]  Pulmonary Sleep Medicine   Office Visit Note  Patient Name: Penny Myers DOB: [birth year] MRN [id number]    Chief Complaint: Obstructive Sleep Apnea visit  Brief History:  Penny Myers is seen today for a follow up visit for APAP@ 5-20 cmH2O and to establish care. The patient has a 5 month history of sleep apnea. Patient is using PAP nightly.  The patient feels rested after sleeping with PAP.  The patient reports benefiting from PAP use. Reported sleepiness is  improved and the Epworth Sleepiness Score is 12 out of 24. The patient does not take naps. The patient complains of the following: pt is in need of replacement supplies.  The compliance download shows 93% compliance with an average use time of 5 hours 59 minutes. The AHI is 1.4.  The patient does not complain of limb movements disrupting sleep. The patient continues to require PAP therapy in order to eliminate sleep apnea.   ROS  General: (-) fever, (-) chills, (-) night sweat Nose and Sinuses: (-) nasal stuffiness or itchiness, (-) postnasal drip, (-) nosebleeds, (-) sinus trouble. Mouth and Throat: (-) sore throat, (-) hoarseness. Neck: (-) swollen glands, (-) enlarged thyroid , (-) neck pain. Respiratory: - cough, - shortness of breath, - wheezing. Neurologic: - numbness, - tingling. Psychiatric: - anxiety, - depression   Current Medication: Outpatient Encounter Medications as of 01/14/2024  Medication Sig   celecoxib  (CELEBREX ) 200 MG capsule Take 1 capsule (200 mg total) by mouth daily.   cyclobenzaprine  (FLEXERIL ) 10 MG tablet Take 10 mg by mouth 3 (three) times daily as needed.   diclofenac Sodium (VOLTAREN) 1 % GEL Voltaren 1% External Gel QTY: 50 gram Days: 30 Refills: 2  Written: 06/30/21 Patient Instructions: apply bid to the area for 2 weks   EMGALITY 120 MG/ML SOAJ Inject into the skin.   fluticasone  (FLONASE ) 50 MCG/ACT nasal spray Place 1 spray into  both nostrils daily.   hydrochlorothiazide (MICROZIDE) 12.5 MG capsule TAKE ONE CAPSULE BY MOUTH ONE TIME DAILY IF BLOOD PRESSURE IS ELEVATED   JARDIANCE  25 MG TABS tablet TAKE ONE TABLET BY MOUTH ONE TIME DAILY BEFORE BREAKFAST   loratadine (CLARITIN) 10 MG tablet Take by mouth.   losartan (COZAAR) 100 MG tablet TAKE ONE TABLET BY MOUTH ONE TIME DAILY   omeprazole  (PRILOSEC) 40 MG capsule Take 1 capsule (40 mg total) by mouth 2 (two) times daily.   venlafaxine (EFFEXOR) 37.5 MG tablet Take 37.5 mg by mouth.   No facility-administered encounter medications on file as of 01/14/2024.    Surgical History: Past Surgical History:  Procedure Laterality Date   APPENDECTOMY     CHOLECYSTECTOMY     ESOPHAGOGASTRODUODENOSCOPY N/A 01/08/2024   Procedure: EGD (ESOPHAGOGASTRODUODENOSCOPY);  Surgeon: Selena Daily, MD;  Location: [facility];  Service: Endoscopy;  Laterality: N/A;   SHOULDER ARTHROSCOPY WITH SUBACROMIAL DECOMPRESSION Left 12/09/2015   Procedure: SHOULDER ARTHROSCOPY WITH SUBACROMIAL DECOMPRESSION;  Surgeon: Ferd Householder, MD;  Location: [facility];  Service: Orthopedics;  Laterality: Left;   TUBAL LIGATION      Medical History: Past Medical History:  Diagnosis Date   Acid reflux    Anemia    Anxiety    Atypical chest pain 07/02/2015   Note: Unchanged Note: Unchanged   Family history of malignant neoplasm 09/28/2015   Note: Unchanged   Heart murmur    Hypertension    States that she does not have HBP,  but takes losartan for leaking valve.   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HCC)    Migraine headache    Sleep apnea    APAP   Type 2 diabetes mellitus (HCC)    Wears contact lenses     Family History: Non contributory to the present illness  Social History: Social History   Socioeconomic History   Marital status: Married    Spouse name: Not on file   Number of children: Not on file   Years of education: Not on file   Highest education  level: Not on file  Occupational History   Not on file  Tobacco Use   Smoking status: Every Day    Current packs/day: 0.30    Average packs/day: 0.3 packs/day for 36.3 years (10.9 ttl pk-yrs)    Types: Cigarettes    Start date: 1989   Smokeless tobacco: Never  Vaping Use   Vaping status: Never Used  Substance and Sexual Activity   Alcohol use: Yes    Comment: occ   Drug use: Yes    Types: Marijuana   Sexual activity: Not on file  Other Topics Concern   Not on file  Social History Narrative   Not on file   Social Drivers of Health   Financial Resource Strain: Not on file  Food Insecurity: Not on file  Transportation Needs: Not on file  Physical Activity: Not on file  Stress: Not on file  Social Connections: Not on file  Intimate Partner Violence: Not on file    Vital Signs: Blood pressure 119/89, pulse 68, resp. rate 16, height 5\' 4"  (1.626 m), weight 201 lb (91.2 kg), SpO2 98%. Body mass index is 34.5 kg/m.    Examination: General Appearance: The patient is well-developed, well-nourished, and in no distress. Neck Circumference: 36 cm Skin: Gross inspection of skin unremarkable. Head: normocephalic, no gross deformities. Eyes: no gross deformities noted. ENT: ears appear grossly normal Neurologic: Alert and oriented. No involuntary movements.  STOP BANG RISK ASSESSMENT S (snore) Have you been told that you snore?     NO   T (tired) Are you often tired, fatigued, or sleepy during the day?   NO  O (obstruction) Do you stop breathing, choke, or gasp during sleep? NO   P (pressure) Do you have or are you being treated for high blood pressure? YES   B (BMI) Is your body index greater than 35 kg/m? NO   A (age) Are you 47 years old or older? YES   N (neck) Do you have a neck circumference greater than 16 inches?   NO   G (gender) Are you a female? NO   TOTAL STOP/BANG "YES" ANSWERS 2       A STOP-Bang score of 2 or less is considered low risk, and a score  of 5 or more is high risk for having either moderate or severe OSA. For people who score 3 or 4, doctors may need to perform further assessment to determine how likely they are to have OSA.         EPWORTH SLEEPINESS SCALE:  Scale:  (0)= no chance of dozing; (1)= slight chance of dozing; (2)= moderate chance of dozing; (3)= high chance of dozing  Chance  Situtation    Sitting and reading: 3    Watching TV: 2    Sitting Inactive in public: 2    As a passenger in car: 2      Lying down to rest: 0  Sitting and talking: 0    Sitting quielty after lunch: 2    In a car, stopped in traffic: 1   TOTAL SCORE:   12 out of 24    SLEEP STUDIES:  PSG (07/2023) AHI 8/hr, REM AHI 26/hr, min SpO2 86%    CPAP COMPLIANCE DATA:  Date Range: 09/13/2023-01/10/2024  Average Daily Use: 5 hours 59 minutes  Median Use: 6 hours 1 minute  Compliance for > 4 Hours: 93%  AHI: 1.4 respiratory events per hour  Days Used: 116/120 days  Mask Leak: 10.3  95th Percentile Pressure: 11.2         LABS: Recent Results (from the past 2160 hours)  Hemoglobin A1c     Status: Abnormal   Collection Time: 12/10/23  8:29 AM  Result Value Ref Range   Hgb A1c MFr Bld 6.3 (H) 4.8 - 5.6 %    Comment:          Prediabetes: 5.7 - 6.4          Diabetes: >6.4          Glycemic control for adults with diabetes: <7.0    Est. average glucose Bld gHb Est-mCnc 134 mg/dL  Lipid Panel w/o Chol/HDL Ratio     Status: None   Collection Time: 12/10/23  8:29 AM  Result Value Ref Range   Cholesterol, Total 141 100 - 199 mg/dL   Triglycerides 161 0 - 149 mg/dL   HDL 53 >09 mg/dL   VLDL Cholesterol Cal 19 5 - 40 mg/dL   LDL Chol Calc (NIH) 69 0 - 99 mg/dL  [id number]     Status: Abnormal   Collection Time: 12/10/23  8:29 AM  Result Value Ref Range   Glucose 89 70 - 99 mg/dL   BUN 19 6 - 24 mg/dL   Creatinine, Ser 1.91 (H) 0.57 - 1.00 mg/dL   eGFR 60 >47 WG/NFA/2.13   BUN/Creatinine Ratio 17 9 -  23   Sodium 142 134 - 144 mmol/L   Potassium 4.1 3.5 - 5.2 mmol/L   Chloride 106 96 - 106 mmol/L   CO2 23 20 - 29 mmol/L   Calcium 9.3 8.7 - 10.2 mg/dL   Total Protein 6.2 6.0 - 8.5 g/dL   Albumin 4.1 3.8 - 4.9 g/dL   Globulin, Total 2.1 1.5 - 4.5 g/dL   Bilirubin Total 0.3 0.0 - 1.2 mg/dL   Alkaline Phosphatase 81 44 - 121 IU/L   AST 11 0 - 40 IU/L   ALT 17 0 - 32 IU/L  Pregnancy, urine POC     Status: None   Collection Time: 01/08/24  9:55 AM  Result Value Ref Range   Preg Test, Ur NEGATIVE NEGATIVE    Comment:        THE SENSITIVITY OF THIS METHODOLOGY IS >24 mIU/mL   Glucose, capillary     Status: Abnormal   Collection Time: 01/08/24 10:06 AM  Result Value Ref Range   Glucose-Capillary 106 (H) 70 - 99 mg/dL    Comment: Glucose reference range applies only to samples taken after fasting for at least 8 hours.    Radiology: No results found.  No results found.  No results found.    Assessment and Plan: Patient Active Problem List   Diagnosis Date Noted   Dysphagia 01/08/2024   Chronic GERD 01/08/2024   Hypertension associated with diabetes (HCC) 09/25/2023   Witnessed episode of apnea 07/16/2023   GAD (generalized anxiety disorder) 06/15/2023   Mixed  hyperlipidemia 06/15/2023   Migraine without aura and without status migrainosus, not intractable 06/15/2023   Type 2 diabetes mellitus without complication, without long-term current use of insulin (HCC) 03/08/2023   Epigastric pain 03/08/2023   OSA (obstructive sleep apnea) 03/08/2023   Lymphocytosis 01/29/2022   Bilateral high frequency sensorineural hearing loss 12/08/2021   Chronic daily headache 11/10/2021   Hypertrophy of nasal turbinates 11/10/2021   Tinnitus of left ear 11/10/2021   Benign neoplasm of bronchus and lung 09/28/2015   Essential hypertension, benign 09/28/2015   Current smoker 07/02/2015   Allergic rhinitis 07/19/2007   Esophageal reflux 09/05/2006   Iron deficiency anemia 09/05/2006    1. OSA (obstructive sleep apnea) (Primary) The patient does tolerate PAP and reports  benefit from PAP use. The patient was reminded how to clean equipment and advised to replace supplies routinely. The patient was also counselled on weight loss. The compliance is good . The AHI is 1.4.   OSA on cpap- controlled. Continue with good  compliance with pap. CPAP continues to be medically necessary to treat this patient's OSA. F/u 38m  2. CPAP use counseling CPAP Counseling: had a lengthy discussion with the patient regarding the importance of PAP therapy in management of the sleep apnea. Patient appears to understand the risk factor reduction and also understands the risks associated with untreated sleep apnea. Patient will try to make a good faith effort to remain compliant with therapy. Also instructed the patient on proper cleaning of the device including the water must be changed daily if possible and use of distilled water is preferred. Patient understands that the machine should be regularly cleaned with appropriate recommended cleaning solutions that do not damage the PAP machine for example given white vinegar and water rinses. Other methods such as ozone treatment may not be as good as these simple methods to achieve cleaning.     General Counseling: I have discussed the findings of the evaluation and examination with Penny Myers.  I have also discussed any further diagnostic evaluation thatmay be needed or ordered today. Penny Myers verbalizes understanding of the findings of todays visit. We also reviewed her medications today and discussed drug interactions and side effects including but not limited excessive drowsiness and altered mental states. We also discussed that there is always a risk not just to her but also people around her. she has been encouraged to call the office with any questions or concerns that should arise related to todays visit.  No orders of the defined types were placed in this  encounter.       I have personally obtained a history, examined the patient, evaluated laboratory and imaging results, formulated the assessment and plan and placed orders. This patient was seen today by Penny Rous, PA-C in collaboration with Dr. Cam Cava.   Cordie Deters, MD [facility] Diplomate ABMS Pulmonary Critical Care Medicine and Sleep Medicine

## 2024-01-14 ENCOUNTER — Ambulatory Visit (INDEPENDENT_AMBULATORY_CARE_PROVIDER_SITE_OTHER): Admitting: Internal Medicine

## 2024-01-14 VITALS — BP 119/89 | HR 68 | Resp 16 | Ht 64.0 in | Wt 201.0 lb

## 2024-01-14 DIAGNOSIS — G4733 Obstructive sleep apnea (adult) (pediatric): Secondary | ICD-10-CM

## 2024-01-14 DIAGNOSIS — Z7189 Other specified counseling: Secondary | ICD-10-CM | POA: Insufficient documentation

## 2024-01-14 NOTE — Patient Instructions (Signed)

## 2024-01-29 ENCOUNTER — Ambulatory Visit: Admitting: Gastroenterology

## 2024-01-29 VITALS — BP 112/78 | HR 81 | Temp 98.7°F | Wt 203.0 lb

## 2024-01-29 DIAGNOSIS — K219 Gastro-esophageal reflux disease without esophagitis: Secondary | ICD-10-CM | POA: Diagnosis not present

## 2024-01-29 DIAGNOSIS — K5909 Other constipation: Secondary | ICD-10-CM

## 2024-01-29 MED ORDER — TRULANCE 3 MG PO TABS: 3.0000 mg | ORAL_TABLET | Freq: Every day | ORAL | Status: DC

## 2024-01-29 NOTE — Progress Notes (Addendum)
 Penny Oz, MD [address]  [address]  [place], [place] [address]  Main: [phone]  Fax: [phone]    Gastroenterology Consultation  Referring Provider:     Aisha Hove, MD Primary Care Physician:  Patient, No Pcp Per Primary Gastroenterologist: Dr. Ellis Guys Reason for Consultation: Chronic constipation        HPI:   Penny Myers is a 53 y.o. female referred by Dr. Patient, No Pcp Per  for consultation & management of chronic GERD, epigastric pain.  Patient has history of chronic GERD, maintained on Protonix  40 mg daily.  She has history of hiatal hernia, her main concern today is pain in the epigastric area, especially when she bends over, she feels like something is popping out associated with reflux.  She is s/p cholecystectomy.  She went to ER at [facility] end of May because of epigastric pain, underwent CT abdomen pelvis with contrast which did not reveal any acute intra-abdominal pathology, ultrasound right upper quadrant was unrevealing except for liver echogenicity.  Serum lipase, LFTs, CBC were normal at that time.  She denies any abdominal bloating.  She does report difficulty moving her bowels, tried Metamucil which she felt everything was blocked.  She admits to not drinking adequate amount of water, not incorporating good amount of natural fiber in her daily meals.  She also identifies triggers such as spaghetti, pizza, tomato sauce, coffee.  She has to drink coffee in the morning.  Follow-up visit 01/29/2024 [name] called our office recently regarding epigastric pain and trouble swallowing, underwent EGD which was unremarkable.  I have asked her to switch to omeprazole  which she did not because it did not work in the past.  She continues to take pantoprazole  40 mg twice daily.  Today, she states that probably her epigastric pain is secondary to severe constipation and abdominal bloating and everything being backed up, incomplete emptying.  She has lost  about 80 pounds within the last few months since adopting and eating habits because her husband is started on dialysis.  She has significantly cut back on eating sugars.  She is working on cutting down on carbohydrates as well.  Mostly sedentary lifestyle.  She has tried Linzess in the past, not sure what dose she has tried, resulted in diarrhea.  She has to take 2 suppositories along with Senokot in order to move her bowels currently.  NSAIDs: None  Antiplts/Anticoagulants/Anti thrombotics: None  GI Procedures:  Upper endoscopy 01/08/2024 - Normal esophagus. - Normal stomach. - Normal examined duodenum. - No specimens collected.  Colonoscopy 11/08/2020 at [facility]   The terminal ileum appeared normal.       Scattered areas of nodular mucosa were found in the entire colon.       Biopsies were taken with a cold forceps for histology.       Scattered small-mouthed diverticula were found in the sigmoid colon.       The exam was otherwise without abnormality on direct and retroflexion       views. No masses or polyp appreciated.  A: Colon, biopsy: -Multiple fragments of tubular adenoma and polypoid colonic mucosa with a mucosal lymphoid aggregate.   Past Medical History:  Diagnosis Date   Acid reflux    Anemia    Anxiety    Atypical chest pain 07/02/2015   Note: Unchanged Note: Unchanged   Family history of malignant neoplasm 09/28/2015   Note: Unchanged   Heart murmur    Hypertension  States that she does not have HBP, but takes losartan for leaking valve.   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HCC)    Migraine headache    Sleep apnea    APAP   Type 2 diabetes mellitus (HCC)    Wears contact lenses     Past Surgical History:  Procedure Laterality Date   APPENDECTOMY     CHOLECYSTECTOMY     ESOPHAGOGASTRODUODENOSCOPY N/A 01/08/2024   Procedure: EGD (ESOPHAGOGASTRODUODENOSCOPY);  Surgeon: Selena Daily, MD;  Location: [facility];  Service: Endoscopy;  Laterality: N/A;    SHOULDER ARTHROSCOPY WITH SUBACROMIAL DECOMPRESSION Left 12/09/2015   Procedure: SHOULDER ARTHROSCOPY WITH SUBACROMIAL DECOMPRESSION;  Surgeon: Ferd Householder, MD;  Location: [facility];  Service: Orthopedics;  Laterality: Left;   TUBAL LIGATION       Current Outpatient Medications:    celecoxib  (CELEBREX ) 200 MG capsule, Take 1 capsule (200 mg total) by mouth daily., Disp: 30 capsule, Rfl: 2   cyclobenzaprine  (FLEXERIL ) 10 MG tablet, Take 10 mg by mouth 3 (three) times daily as needed., Disp: , Rfl:    diclofenac Sodium (VOLTAREN) 1 % GEL, Voltaren 1% External Gel QTY: 50 gram Days: 30 Refills: 2  Written: 06/30/21 Patient Instructions: apply bid to the area for 2 weks, Disp: , Rfl:    EMGALITY 120 MG/ML SOAJ, Inject into the skin., Disp: , Rfl:    fluticasone  (FLONASE ) 50 MCG/ACT nasal spray, Place 1 spray into both nostrils daily., Disp: 11.1 mL, Rfl: 3   hydrochlorothiazide (MICROZIDE) 12.5 MG capsule, TAKE ONE CAPSULE BY MOUTH ONE TIME DAILY IF BLOOD PRESSURE IS ELEVATED, Disp: 90 capsule, Rfl: 1   JARDIANCE  25 MG TABS tablet, TAKE ONE TABLET BY MOUTH ONE TIME DAILY BEFORE BREAKFAST, Disp: 30 tablet, Rfl: 2   loratadine (CLARITIN) 10 MG tablet, Take by mouth., Disp: , Rfl:    losartan (COZAAR) 100 MG tablet, TAKE ONE TABLET BY MOUTH ONE TIME DAILY, Disp: 90 tablet, Rfl: 1   omeprazole  (PRILOSEC) 40 MG capsule, Take 1 capsule (40 mg total) by mouth 2 (two) times daily., Disp: 60 capsule, Rfl: 0   Plecanatide (TRULANCE) 3 MG TABS, Take 1 tablet (3 mg total) by mouth daily before breakfast. 30 minutes before your first meal, Disp: 6 tablet, Rfl:    venlafaxine (EFFEXOR) 37.5 MG tablet, Take 37.5 mg by mouth., Disp: , Rfl:    Family History  Problem Relation Age of Onset   Cancer Maternal Aunt    Breast cancer Maternal Aunt        40's   Gastric cancer Maternal Grandmother      Social History   Tobacco Use   Smoking status: Every Day    Current packs/day: 0.30     Average packs/day: 0.3 packs/day for 36.4 years (10.9 ttl pk-yrs)    Types: Cigarettes    Start date: 1989   Smokeless tobacco: Never  Vaping Use   Vaping status: Never Used  Substance Use Topics   Alcohol use: Yes    Comment: occ   Drug use: Yes    Types: Marijuana    Allergies as of 01/29/2024   (No Known Allergies)    Review of Systems:    All systems reviewed and negative except where noted in HPI.   Physical Exam:  BP 112/78 (BP Location: Left Arm, Patient Position: Sitting, Cuff Size: Large)   Pulse 81   Temp 98.7 F (37.1 C) (Oral)   Wt 203 lb (92.1 kg)   BMI 34.84  kg/m  No LMP recorded. Patient has had an ablation.  General:   Alert,  Well-developed, well-nourished, pleasant and cooperative in NAD Head:  Normocephalic and atraumatic. Eyes:  Sclera clear, no icterus.   Conjunctiva pink. Ears:  Normal auditory acuity. Nose:  No deformity, discharge, or lesions. Mouth:  No deformity or lesions,oropharynx pink & moist. Neck:  Supple; no masses or thyromegaly. Lungs:  Respirations even and unlabored.  Clear throughout to auscultation.   No wheezes, crackles, or rhonchi. No acute distress. Heart:  Regular rate and rhythm; no murmurs, clicks, rubs, or gallops. Abdomen:  Normal bowel sounds. Soft, non-tender and non-distended without masses, hepatosplenomegaly or hernias noted.  No guarding or rebound tenderness.   Rectal: Not performed Msk:  Symmetrical without gross deformities. Good, equal movement & strength bilaterally. Pulses:  Normal pulses noted. Extremities:  No clubbing or edema.  No cyanosis. Neurologic:  Alert and oriented x3;  grossly normal neurologically. Skin:  Intact without significant lesions or rashes. No jaundice. Psych:  Alert and cooperative. Normal mood and affect.  Imaging Studies: Reviewed  Assessment and Plan:   CRYSTALE PFOHL is a 53 y.o. pleasant African-American female with BMI of 35.57, hypertension, chronic GERD is seen in  consultation for worsening of chronic constipation  Chronic GERD Continue Protonix  to 40 mg p.o. twice daily before meals Reiterated on antireflux lifestyle Educated about healthy eating habits, incorporate physical activity and exercise into daily routine  Chronic constipation Trial of Trulance, samples provided Discussed about increase fiber intake,and 2 to 3 L of water daily and physical activity of 10,000 steps daily along with 3 to 5 days of moderate to intense exercise weekly    Follow up as needed  Penny Oz, MD

## 2024-03-24 ENCOUNTER — Other Ambulatory Visit

## 2024-03-24 ENCOUNTER — Ambulatory Visit (INDEPENDENT_AMBULATORY_CARE_PROVIDER_SITE_OTHER): Admitting: Internal Medicine

## 2024-03-24 ENCOUNTER — Encounter: Payer: Self-pay | Admitting: Internal Medicine

## 2024-03-24 ENCOUNTER — Ambulatory Visit: Payer: Self-pay | Admitting: Internal Medicine

## 2024-03-24 VITALS — BP 110/82 | HR 77 | Ht 64.0 in | Wt 206.4 lb

## 2024-03-24 DIAGNOSIS — Z0000 Encounter for general adult medical examination without abnormal findings: Secondary | ICD-10-CM

## 2024-03-24 DIAGNOSIS — E785 Hyperlipidemia, unspecified: Secondary | ICD-10-CM

## 2024-03-24 DIAGNOSIS — I152 Hypertension secondary to endocrine disorders: Secondary | ICD-10-CM

## 2024-03-24 DIAGNOSIS — E1169 Type 2 diabetes mellitus with other specified complication: Secondary | ICD-10-CM

## 2024-03-24 DIAGNOSIS — R911 Solitary pulmonary nodule: Secondary | ICD-10-CM | POA: Diagnosis not present

## 2024-03-24 DIAGNOSIS — E1159 Type 2 diabetes mellitus with other circulatory complications: Secondary | ICD-10-CM | POA: Diagnosis not present

## 2024-03-24 DIAGNOSIS — Z0001 Encounter for general adult medical examination with abnormal findings: Secondary | ICD-10-CM | POA: Diagnosis not present

## 2024-03-24 DIAGNOSIS — K219 Gastro-esophageal reflux disease without esophagitis: Secondary | ICD-10-CM

## 2024-03-24 DIAGNOSIS — K581 Irritable bowel syndrome with constipation: Secondary | ICD-10-CM | POA: Insufficient documentation

## 2024-03-24 DIAGNOSIS — Z1231 Encounter for screening mammogram for malignant neoplasm of breast: Secondary | ICD-10-CM | POA: Diagnosis not present

## 2024-03-24 DIAGNOSIS — E119 Type 2 diabetes mellitus without complications: Secondary | ICD-10-CM

## 2024-03-24 LAB — POC CREATINE & ALBUMIN,URINE
Albumin/Creatinine Ratio, Urine, POC: <30
Creatinine, POC: 300 mg/dL
Microalbumin Ur, POC: 30 mg/L

## 2024-03-24 MED ORDER — LINACLOTIDE 145 MCG PO CAPS
145.0000 ug | ORAL_CAPSULE | Freq: Every day | ORAL | 6 refills | Status: DC
Start: 2024-03-24 — End: 2024-05-01

## 2024-03-24 NOTE — Progress Notes (Signed)
 Established Patient Office Visit  Subjective:  Patient ID: Penny Myers, female    DOB: 11/15/1970  Age: 53 y.o. MRN: [id number]  Chief Complaint  Patient presents with   Annual Exam    CPE    Patient comes in for her follow up. Recently had EGD which was normal. Continues to c/o constipation, Trulance  was prescribed by the GI but sees did not work so stopped it. Previously has taken Linzess  but thinks it worked too well, however will try it again. Mentions her chronic mid epigastric pain- had abdominal CT and ultrasound last year which was unremarkable.  Incidental finding was a 6 mm nodule in the left lung base, needs a repeat CT for monitoring.  Denies chest pain or shortness of breath no cough and no chest congestion. Mammogram to be scheduled.  Patient had Pap in 2023 which was negative. Colonoscopy was 2022. Urine microalbumin to be done today.    No other concerns at this time.   Past Medical History:  Diagnosis Date   Acid reflux    Anemia    Anxiety    Atypical chest pain 07/02/2015   Note: Unchanged Note: Unchanged   Family history of malignant neoplasm 09/28/2015   Note: Unchanged   Heart murmur    Hypertension    States that she does not have HBP, but takes losartan for leaking valve.   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HCC)    Migraine headache    Sleep apnea    APAP   Type 2 diabetes mellitus (HCC)    Wears contact lenses     Past Surgical History:  Procedure Laterality Date   APPENDECTOMY     CHOLECYSTECTOMY     ESOPHAGOGASTRODUODENOSCOPY N/A 01/08/2024   Procedure: EGD (ESOPHAGOGASTRODUODENOSCOPY);  Surgeon: Unk Corinn Skiff, MD;  Location[facility];  Service: Endoscopy;  Laterality: N/A;   SHOULDER ARTHROSCOPY WITH SUBACROMIAL DECOMPRESSION Left 12/09/2015   Procedure: SHOULDER ARTHROSCOPY WITH SUBACROMIAL DECOMPRESSION;  Surgeon: Toribio JULIANNA Chancy, MD;  Location: [facility];  Service: Orthopedics;  Laterality: Left;    TUBAL LIGATION      Social History   Socioeconomic History   Marital status: Married    Spouse name: Not on file   Number of children: Not on file   Years of education: Not on file   Highest education level: Not on file  Occupational History   Not on file  Tobacco Use   Smoking status: Every Day    Current packs/day: 0.30    Average packs/day: 0.3 packs/day for 36.5 years (11.0 ttl pk-yrs)    Types: Cigarettes    Start date: 1989   Smokeless tobacco: Never  Vaping Use   Vaping status: Never Used  Substance and Sexual Activity   Alcohol use: Yes    Comment: occ   Drug use: Yes    Types: Marijuana   Sexual activity: Not on file  Other Topics Concern   Not on file  Social History Narrative   Not on file   Social Drivers of Health   Financial Resource Strain: Not on file  Food Insecurity: Not on file  Transportation Needs: Not on file  Physical Activity: Not on file  Stress: Not on file  Social Connections: Not on file  Intimate Partner Violence: Not on file    Family History  Problem Relation Age of Onset   Cancer Maternal Aunt    Breast cancer Maternal Aunt        [phone]  Gastric cancer Maternal Grandmother     No Known Allergies  Outpatient Medications Prior to Visit  Medication Sig   celecoxib  (CELEBREX ) 200 MG capsule Take 1 capsule (200 mg total) by mouth daily.   cyclobenzaprine  (FLEXERIL ) 10 MG tablet Take 10 mg by mouth 3 (three) times daily as needed.   diclofenac Sodium (VOLTAREN) 1 % GEL Voltaren 1% External Gel QTY: 50 gram Days: 30 Refills: 2  Written: 06/30/21 Patient Instructions: apply bid to the area for 2 weks   fluticasone  (FLONASE ) 50 MCG/ACT nasal spray Place 1 spray into both nostrils daily.   hydrochlorothiazide (MICROZIDE) 12.5 MG capsule TAKE ONE CAPSULE BY MOUTH ONE TIME DAILY IF BLOOD PRESSURE IS ELEVATED   JARDIANCE  25 MG TABS tablet TAKE ONE TABLET BY MOUTH ONE TIME DAILY BEFORE BREAKFAST   loratadine (CLARITIN) 10 MG tablet Take  by mouth.   losartan (COZAAR) 100 MG tablet TAKE ONE TABLET BY MOUTH ONE TIME DAILY   venlafaxine (EFFEXOR) 37.5 MG tablet Take 37.5 mg by mouth.   EMGALITY 120 MG/ML SOAJ Inject into the skin. (Patient not taking: Reported on 03/24/2024)   omeprazole  (PRILOSEC) 40 MG capsule Take 1 capsule (40 mg total) by mouth 2 (two) times daily. (Patient not taking: Reported on 03/24/2024)   [DISCONTINUED] Plecanatide  (TRULANCE ) 3 MG TABS Take 1 tablet (3 mg total) by mouth daily before breakfast. 30 minutes before your first meal (Patient not taking: Reported on 03/24/2024)   No facility-administered medications prior to visit.    Review of Systems  Constitutional: Negative.  Negative for chills, diaphoresis, fever, malaise/fatigue and weight loss.  HENT: Negative.  Negative for sore throat.   Eyes: Negative.   Respiratory: Negative.  Negative for cough and shortness of breath.   Cardiovascular: Negative.  Negative for chest pain, palpitations and leg swelling.  Gastrointestinal:  Positive for constipation, heartburn and nausea. Negative for abdominal pain, blood in stool, diarrhea, melena and vomiting.  Genitourinary: Negative.  Negative for dysuria and flank pain.  Musculoskeletal: Negative.  Negative for joint pain and myalgias.  Skin: Negative.   Neurological: Negative.  Negative for dizziness, tingling, tremors and headaches.  Endo/Heme/Allergies: Negative.   Psychiatric/Behavioral: Negative.  Negative for depression and suicidal ideas. The patient is not nervous/anxious.        Objective:   BP 110/82   Pulse 77   Ht 5' 4 (1.626 m)   Wt 206 lb 6.4 oz (93.6 kg)   SpO2 99%   BMI 35.43 kg/m   Vitals:   03/24/24 1323  BP: 110/82  Pulse: 77  Height: 5' 4 (1.626 m)  Weight: 206 lb 6.4 oz (93.6 kg)  SpO2: 99%  BMI (Calculated): 35.41    Physical Exam Vitals and nursing note reviewed.  Constitutional:      Appearance: Normal appearance.  HENT:     Head: Normocephalic and atraumatic.      Nose: Nose normal.     Mouth/Throat:     Mouth: Mucous membranes are moist.     Pharynx: Oropharynx is clear.  Eyes:     Conjunctiva/sclera: Conjunctivae normal.     Pupils: Pupils are equal, round, and reactive to light.  Cardiovascular:     Rate and Rhythm: Normal rate and regular rhythm.     Pulses: Normal pulses.     Heart sounds: Normal heart sounds. No murmur heard. Pulmonary:     Effort: Pulmonary effort is normal.     Breath sounds: Normal breath sounds. No wheezing.  Abdominal:  General: Bowel sounds are normal.     Palpations: Abdomen is soft.     Tenderness: There is no abdominal tenderness. There is no right CVA tenderness or left CVA tenderness.  Musculoskeletal:        General: Normal range of motion.     Cervical back: Normal range of motion.     Right lower leg: No edema.     Left lower leg: No edema.  Skin:    General: Skin is warm and dry.  Neurological:     General: No focal deficit present.     Mental Status: She is alert and oriented to person, place, and time.  Psychiatric:        Mood and Affect: Mood normal.        Behavior: Behavior normal.      Results for orders placed or performed in visit on 03/24/24  POC CREATINE & ALBUMIN,URINE  Result Value Ref Range   Microalbumin Ur, POC 30 mg/L   Creatinine, POC 300 mg/dL   Albumin/Creatinine Ratio, Urine, POC <30     Recent Results (from the past 2160 hours)  Pregnancy, urine POC     Status: None   Collection Time: 01/08/24  9:55 AM  Result Value Ref Range   Preg Test, Ur NEGATIVE NEGATIVE    Comment:        THE SENSITIVITY OF THIS METHODOLOGY IS >24 mIU/mL   Glucose, capillary     Status: Abnormal   Collection Time: 01/08/24 10:06 AM  Result Value Ref Range   Glucose-Capillary 106 (H) 70 - 99 mg/dL    Comment: Glucose reference range applies only to samples taken after fasting for at least 8 hours.  POC CREATINE & ALBUMIN,URINE     Status: None   Collection Time: 03/24/24  2:07 PM   Result Value Ref Range   Microalbumin Ur, POC 30 mg/L   Creatinine, POC 300 mg/dL   Albumin/Creatinine Ratio, Urine, POC <30       Assessment & Plan:  Schedule CT lung for the left lung nodule.  Schedule mammogram.  Trial of Linzess .  Check blood work today. Problem List Items Addressed This Visit     Esophageal reflux   Relevant Medications   linaclotide  (LINZESS ) 145 MCG CAPS capsule   Other Relevant Orders   CBC with Diff   Dyslipidemia associated with type 2 diabetes mellitus (HCC)   Relevant Orders   Lipid Panel w/o Chol/HDL Ratio   Hypertension associated with diabetes (HCC) - Primary   Relevant Orders   CMP14+EGFR   Irritable bowel syndrome with constipation   Relevant Medications   linaclotide  (LINZESS ) 145 MCG CAPS capsule   Other Visit Diagnoses       Incidental lung nodule, > 3mm and < 8mm       Relevant Orders   CT CHEST WO CONTRAST     Breast cancer screening by mammogram       Relevant Orders   MM 3D SCREENING MAMMOGRAM BILATERAL BREAST     Health maintenance examination           Return in about 1 month (around 04/24/2024).   Total time spent: 30 minutes  FERNAND FREDY RAMAN, MD  03/24/2024   This document may have been prepared by [facility] Voice Recognition software and as such may include unintentional dictation errors.

## 2024-03-25 ENCOUNTER — Encounter: Admitting: Internal Medicine

## 2024-03-25 LAB — CMP14+EGFR
ALT: 17 IU/L (ref 0–32)
AST: 18 IU/L (ref 0–40)
Albumin: 4.3 g/dL (ref 3.8–4.9)
Alkaline Phosphatase: 91 IU/L (ref 44–121)
BUN/Creatinine Ratio: 17 (ref 9–23)
BUN: 15 mg/dL (ref 6–24)
Bilirubin Total: 0.4 mg/dL (ref 0.0–1.2)
CO2: 20 mmol/L (ref 20–29)
Calcium: 9.4 mg/dL (ref 8.7–10.2)
Chloride: 109 mmol/L — ABNORMAL HIGH (ref 96–106)
Creatinine, Ser: 0.9 mg/dL (ref 0.57–1.00)
Globulin, Total: 2.3 g/dL (ref 1.5–4.5)
Glucose: 94 mg/dL (ref 70–99)
Potassium: 4.1 mmol/L (ref 3.5–5.2)
Sodium: 144 mmol/L (ref 134–144)
Total Protein: 6.6 g/dL (ref 6.0–8.5)
eGFR: 76 mL/min/1.73 (ref >59)

## 2024-03-25 LAB — CBC WITH DIFFERENTIAL/PLATELET
Basophils Absolute: 0 x10E3/uL (ref 0.0–0.2)
Basos: 1 %
EOS (ABSOLUTE): 0.1 x10E3/uL (ref 0.0–0.4)
Eos: 1 %
Hematocrit: 45.2 % (ref 34.0–46.6)
Hemoglobin: 14.4 g/dL (ref 11.1–15.9)
Immature Grans (Abs): 0 x10E3/uL (ref 0.0–0.1)
Immature Granulocytes: 0 %
Lymphocytes Absolute: 2.7 x10E3/uL (ref 0.7–3.1)
Lymphs: 35 %
MCH: 28.6 pg (ref 26.6–33.0)
MCHC: 31.9 g/dL (ref 31.5–35.7)
MCV: 90 fL (ref 79–97)
Monocytes Absolute: 0.4 x10E3/uL (ref 0.1–0.9)
Monocytes: 5 %
Neutrophils Absolute: 4.7 x10E3/uL (ref 1.4–7.0)
Neutrophils: 58 %
Platelets: 218 x10E3/uL (ref 150–450)
RBC: 5.03 x10E6/uL (ref 3.77–5.28)
RDW: 13.1 % (ref 11.7–15.4)
WBC: 8 x10E3/uL (ref 3.4–10.8)

## 2024-03-25 LAB — LIPID PANEL W/O CHOL/HDL RATIO
Cholesterol, Total: 132 mg/dL (ref 100–199)
HDL: 51 mg/dL (ref >39)
LDL Chol Calc (NIH): 68 mg/dL (ref 0–99)
Triglycerides: 64 mg/dL (ref 0–149)
VLDL Cholesterol Cal: 13 mg/dL (ref 5–40)

## 2024-03-25 LAB — HEMOGLOBIN A1C
Est. average glucose Bld gHb Est-mCnc: 126 mg/dL
Hgb A1c MFr Bld: 6 % — ABNORMAL HIGH (ref 4.8–5.6)

## 2024-03-25 NOTE — Progress Notes (Signed)
 Patient notified

## 2024-03-26 ENCOUNTER — Telehealth: Payer: Self-pay | Admitting: Internal Medicine

## 2024-03-26 NOTE — Telephone Encounter (Signed)
 Patient left VM that she doesn't want to wait a whole month to evaluate her abdominal issues - she wants to go ahead and get an abdominal U/S. Please order if necessary.

## 2024-03-28 ENCOUNTER — Encounter: Admitting: Internal Medicine

## 2024-03-29 ENCOUNTER — Other Ambulatory Visit: Payer: Self-pay | Admitting: Family

## 2024-03-29 ENCOUNTER — Other Ambulatory Visit: Payer: Self-pay | Admitting: Internal Medicine

## 2024-03-29 DIAGNOSIS — E119 Type 2 diabetes mellitus without complications: Secondary | ICD-10-CM

## 2024-04-15 ENCOUNTER — Telehealth: Payer: Self-pay | Admitting: Internal Medicine

## 2024-04-15 ENCOUNTER — Other Ambulatory Visit: Payer: Self-pay | Admitting: Cardiology

## 2024-04-15 ENCOUNTER — Other Ambulatory Visit

## 2024-04-15 DIAGNOSIS — W57XXXA Bitten or stung by nonvenomous insect and other nonvenomous arthropods, initial encounter: Secondary | ICD-10-CM

## 2024-04-15 NOTE — Telephone Encounter (Signed)
 Patient called in stating she has been bit by a tick and has since been having headaches. When she called and spoke with [place] this morning she was advised we could check labs or she could go to UC/ED. Patient would rather just come here and get labs drawn. Can you please place lab orders?

## 2024-04-16 ENCOUNTER — Other Ambulatory Visit: Payer: Self-pay | Admitting: Internal Medicine

## 2024-04-16 DIAGNOSIS — Z1231 Encounter for screening mammogram for malignant neoplasm of breast: Secondary | ICD-10-CM

## 2024-04-24 ENCOUNTER — Ambulatory Visit: Admitting: Internal Medicine

## 2024-04-25 ENCOUNTER — Telehealth: Payer: Self-pay

## 2024-04-25 NOTE — Telephone Encounter (Signed)
 [facility] wanted to confirm what labs she needed. She needs lipids and A1c per [facility].I left a vm to return call.

## 2024-04-29 ENCOUNTER — Ambulatory Visit: Payer: Self-pay | Admitting: Cardiology

## 2024-04-29 LAB — ALPHA GALACTOSIDASE: Alpha-Galactosidase activity: 91.6 nmol/h/mg{prot} (ref >35.5)

## 2024-04-29 LAB — SPOTTED FEVER GROUP ANTIBODIES
Spotted Fever Group IgG: <1:64 {titer}
Spotted Fever Group IgM: <1:64 {titer}

## 2024-04-29 LAB — LYME DISEASE SEROLOGY W/REFLEX: Lyme Total Antibody EIA: NEGATIVE

## 2024-05-01 ENCOUNTER — Encounter: Payer: Self-pay | Admitting: Internal Medicine

## 2024-05-01 ENCOUNTER — Ambulatory Visit (INDEPENDENT_AMBULATORY_CARE_PROVIDER_SITE_OTHER): Admitting: Internal Medicine

## 2024-05-01 VITALS — BP 112/84 | HR 80 | Ht 64.0 in | Wt 208.6 lb

## 2024-05-01 DIAGNOSIS — E785 Hyperlipidemia, unspecified: Secondary | ICD-10-CM

## 2024-05-01 DIAGNOSIS — E1169 Type 2 diabetes mellitus with other specified complication: Secondary | ICD-10-CM | POA: Diagnosis not present

## 2024-05-01 DIAGNOSIS — K219 Gastro-esophageal reflux disease without esophagitis: Secondary | ICD-10-CM

## 2024-05-01 DIAGNOSIS — K581 Irritable bowel syndrome with constipation: Secondary | ICD-10-CM | POA: Diagnosis not present

## 2024-05-01 DIAGNOSIS — E1159 Type 2 diabetes mellitus with other circulatory complications: Secondary | ICD-10-CM | POA: Diagnosis not present

## 2024-05-01 DIAGNOSIS — E119 Type 2 diabetes mellitus without complications: Secondary | ICD-10-CM

## 2024-05-01 DIAGNOSIS — I152 Hypertension secondary to endocrine disorders: Secondary | ICD-10-CM

## 2024-05-01 MED ORDER — DICYCLOMINE HCL 10 MG PO CAPS: 10.0000 mg | ORAL_CAPSULE | Freq: Three times a day (TID) | ORAL | 3 refills | Status: AC

## 2024-05-01 MED ORDER — LINACLOTIDE 72 MCG PO CAPS: 72.0000 ug | ORAL_CAPSULE | Freq: Every day | ORAL | 3 refills | Status: DC

## 2024-05-01 NOTE — Progress Notes (Signed)
 Established Patient Office Visit  Subjective:  Patient ID: Penny Myers, female    DOB: Aug 12, 1971  Age: 53 y.o. MRN: [id number]  Chief Complaint  Patient presents with   Follow-up    1 month follow up    Patient comes in for follow-up of her ED visit on 04/17/2024.  Patient informed with complaints of epigastric and abdominal pain.  She had an abdominal ultrasound and a CT abdomen and the results were unremarkable.  As well as her blood work.  Patient has history of IBS with constipation.  Patient was discharged home on Protonix , Carafate and Bentyl .  Previously patient has taken Linzess  at 145 mcg but it had caused loose stools.  Agrees to try a smaller dose at 72 mcg, will send in that prescription.  Will also reduce her Bentyl  dose to 10 mg 2-3 times a day.  Patient advised to drink lots of water.  Also will continue her Protonix  regularly.  Currently she is not having significant abdominal cramps.  No fevers no chills and no urinary complaints.    No other concerns at this time.   Past Medical History:  Diagnosis Date   Acid reflux    Anemia    Anxiety    Atypical chest pain 07/02/2015   Note: Unchanged Note: Unchanged   Family history of malignant neoplasm 09/28/2015   Note: Unchanged   Heart murmur    Hypertension    States that she does not have HBP, but takes losartan for leaking valve.   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HCC)    Migraine headache    Sleep apnea    APAP   Type 2 diabetes mellitus (HCC)    Wears contact lenses     Past Surgical History:  Procedure Laterality Date   APPENDECTOMY     CHOLECYSTECTOMY     ESOPHAGOGASTRODUODENOSCOPY N/A 01/08/2024   Procedure: EGD (ESOPHAGOGASTRODUODENOSCOPY);  Surgeon: Unk Corinn Skiff, MD;  Location: [facility];  Service: Endoscopy;  Laterality: N/A;   SHOULDER ARTHROSCOPY WITH SUBACROMIAL DECOMPRESSION Left 12/09/2015   Procedure: SHOULDER ARTHROSCOPY WITH SUBACROMIAL DECOMPRESSION;  Surgeon: Toribio JULIANNA Chancy, MD;  Location: [facility];  Service: Orthopedics;  Laterality: Left;   TUBAL LIGATION      Social History   Socioeconomic History   Marital status: Married    Spouse name: Not on file   Number of children: Not on file   Years of education: Not on file   Highest education level: Not on file  Occupational History   Not on file  Tobacco Use   Smoking status: Every Day    Current packs/day: 0.30    Average packs/day: 0.3 packs/day for 36.6 years (11.0 ttl pk-yrs)    Types: Cigarettes    Start date: 1989   Smokeless tobacco: Never  Vaping Use   Vaping status: Never Used  Substance and Sexual Activity   Alcohol use: Yes    Comment: occ   Drug use: Yes    Types: Marijuana   Sexual activity: Not on file  Other Topics Concern   Not on file  Social History Narrative   Not on file   Social Drivers of Health   Financial Resource Strain: Not on file  Food Insecurity: Not on file  Transportation Needs: Not on file  Physical Activity: Not on file  Stress: Not on file  Social Connections: Not on file  Intimate Partner Violence: Not on file    Family History  Problem Relation Age  of Onset   Cancer Maternal Aunt    Breast cancer Maternal Aunt        40's   Gastric cancer Maternal Grandmother     No Known Allergies  Outpatient Medications Prior to Visit  Medication Sig   celecoxib  (CELEBREX ) 200 MG capsule Take 1 capsule (200 mg total) by mouth daily.   cyclobenzaprine  (FLEXERIL ) 10 MG tablet Take 10 mg by mouth 3 (three) times daily as needed.   EMGALITY 120 MG/ML SOAJ Inject into the skin.   fluticasone  (FLONASE ) 50 MCG/ACT nasal spray Place 1 spray into both nostrils daily.   hydrochlorothiazide (MICROZIDE) 12.5 MG capsule TAKE ONE CAPSULE BY MOUTH ONE TIME DAILY IF BLOOD PRESSURE IS ELEVATED   JARDIANCE  25 MG TABS tablet TAKE ONE TABLET BY MOUTH ONE TIME DAILY BEFORE BREAKFAST   loratadine (CLARITIN) 10 MG tablet Take by mouth.   losartan  (COZAAR) 100 MG tablet TAKE ONE TABLET BY MOUTH ONE TIME DAILY   sucralfate (CARAFATE) 1 g tablet Take 1 g by mouth in the morning, at noon, and at bedtime.   venlafaxine (EFFEXOR) 37.5 MG tablet Take 37.5 mg by mouth.   [DISCONTINUED] dicyclomine  (BENTYL ) 20 MG tablet Take 20 mg by mouth 2 (two) times daily.   diclofenac Sodium (VOLTAREN) 1 % GEL Voltaren 1% External Gel QTY: 50 gram Days: 30 Refills: 2  Written: 06/30/21 Patient Instructions: apply bid to the area for 2 weks (Patient not taking: Reported on 05/01/2024)   [DISCONTINUED] linaclotide  (LINZESS ) 145 MCG CAPS capsule Take 1 capsule (145 mcg total) by mouth daily before breakfast. (Patient not taking: Reported on 05/01/2024)   [DISCONTINUED] omeprazole  (PRILOSEC) 40 MG capsule Take 1 capsule (40 mg total) by mouth 2 (two) times daily. (Patient not taking: Reported on 05/01/2024)   No facility-administered medications prior to visit.    Review of Systems  Constitutional: Negative.  Negative for chills, diaphoresis, fever, malaise/fatigue and weight loss.  HENT: Negative.  Negative for sore throat.   Eyes: Negative.   Respiratory: Negative.  Negative for cough and shortness of breath.   Cardiovascular: Negative.  Negative for chest pain, palpitations and leg swelling.  Gastrointestinal: Negative.  Negative for abdominal pain, constipation, diarrhea, heartburn, nausea and vomiting.  Genitourinary: Negative.  Negative for dysuria and flank pain.  Musculoskeletal: Negative.  Negative for joint pain and myalgias.  Skin: Negative.   Neurological: Negative.  Negative for dizziness, tingling, tremors, sensory change and headaches.  Endo/Heme/Allergies: Negative.   Psychiatric/Behavioral: Negative.  Negative for depression and suicidal ideas. The patient is not nervous/anxious.        Objective:   BP 112/84   Pulse 80   Ht 5' 4 (1.626 m)   Wt 208 lb 9.6 oz (94.6 kg)   SpO2 99%   BMI 35.81 kg/m   Vitals:   05/01/24 1434  BP:  112/84  Pulse: 80  Height: 5' 4 (1.626 m)  Weight: 208 lb 9.6 oz (94.6 kg)  SpO2: 99%  BMI (Calculated): 35.79    Physical Exam Vitals and nursing note reviewed.  Constitutional:      Appearance: Normal appearance.  HENT:     Head: Normocephalic and atraumatic.     Nose: Nose normal.     Mouth/Throat:     Mouth: Mucous membranes are moist.     Pharynx: Oropharynx is clear.  Eyes:     Conjunctiva/sclera: Conjunctivae normal.     Pupils: Pupils are equal, round, and reactive to light.  Cardiovascular:  Rate and Rhythm: Normal rate and regular rhythm.     Pulses: Normal pulses.     Heart sounds: Normal heart sounds. No murmur heard. Pulmonary:     Effort: Pulmonary effort is normal.     Breath sounds: Normal breath sounds. No wheezing.  Abdominal:     General: Bowel sounds are normal.     Palpations: Abdomen is soft.     Tenderness: There is no abdominal tenderness. There is no right CVA tenderness or left CVA tenderness.  Musculoskeletal:        General: Normal range of motion.     Cervical back: Normal range of motion.     Right lower leg: No edema.     Left lower leg: No edema.  Skin:    General: Skin is warm and dry.  Neurological:     General: No focal deficit present.     Mental Status: She is alert and oriented to person, place, and time.  Psychiatric:        Mood and Affect: Mood normal.        Behavior: Behavior normal.      No results found for any visits on 05/01/24.  Recent Results (from the past 2160 hours)  POC CREATINE & ALBUMIN,URINE     Status: None   Collection Time: 03/24/24  2:07 PM  Result Value Ref Range   Microalbumin Ur, POC 30 mg/L   Creatinine, POC 300 mg/dL   Albumin/Creatinine Ratio, Urine, POC <30   Hemoglobin A1c     Status: Abnormal   Collection Time: 03/24/24  2:23 PM  Result Value Ref Range   Hgb A1c MFr Bld 6.0 (H) 4.8 - 5.6 %    Comment:          Prediabetes: 5.7 - 6.4          Diabetes: >6.4          Glycemic control  for adults with diabetes: <7.0    Est. average glucose Bld gHb Est-mCnc 126 mg/dL  Lipid Panel w/o Chol/HDL Ratio     Status: None   Collection Time: 03/24/24  2:23 PM  Result Value Ref Range   Cholesterol, Total 132 100 - 199 mg/dL   Triglycerides 64 0 - 149 mg/dL   HDL 51 >60 mg/dL   VLDL Cholesterol Cal 13 5 - 40 mg/dL   LDL Chol Calc (NIH) 68 0 - 99 mg/dL  [id number]     Status: Abnormal   Collection Time: 03/24/24  2:23 PM  Result Value Ref Range   Glucose 94 70 - 99 mg/dL   BUN 15 6 - 24 mg/dL   Creatinine, Ser 9.09 0.57 - 1.00 mg/dL   eGFR 76 >40 fO/fpw/8.26   BUN/Creatinine Ratio 17 9 - 23   Sodium 144 134 - 144 mmol/L   Potassium 4.1 3.5 - 5.2 mmol/L   Chloride 109 (H) 96 - 106 mmol/L   CO2 20 20 - 29 mmol/L   Calcium 9.4 8.7 - 10.2 mg/dL   Total Protein 6.6 6.0 - 8.5 g/dL   Albumin 4.3 3.8 - 4.9 g/dL   Globulin, Total 2.3 1.5 - 4.5 g/dL   Bilirubin Total 0.4 0.0 - 1.2 mg/dL   Alkaline Phosphatase 91 44 - 121 IU/L   AST 18 0 - 40 IU/L   ALT 17 0 - 32 IU/L  CBC with Diff     Status: None   Collection Time: 03/24/24  2:23 PM  Result Value Ref Range  WBC 8.0 3.4 - 10.8 x10E3/uL   RBC 5.03 3.77 - 5.28 x10E6/uL   Hemoglobin 14.4 11.1 - 15.9 g/dL   Hematocrit 54.7 65.9 - 46.6 %   MCV 90 79 - 97 fL   MCH 28.6 26.6 - 33.0 pg   MCHC 31.9 31.5 - 35.7 g/dL   RDW 86.8 88.2 - 84.5 %   Platelets 218 150 - 450 x10E3/uL   Neutrophils 58 Not Estab. %   Lymphs 35 Not Estab. %   Monocytes 5 Not Estab. %   Eos 1 Not Estab. %   Basos 1 Not Estab. %   Neutrophils Absolute 4.7 1.4 - 7.0 x10E3/uL   Lymphocytes Absolute 2.7 0.7 - 3.1 x10E3/uL   Monocytes Absolute 0.4 0.1 - 0.9 x10E3/uL   EOS (ABSOLUTE) 0.1 0.0 - 0.4 x10E3/uL   Basophils Absolute 0.0 0.0 - 0.2 x10E3/uL   Immature Granulocytes 0 Not Estab. %   Immature Grans (Abs) 0.0 0.0 - 0.1 x10E3/uL  Lyme Disease Serology w/Reflex     Status: None   Collection Time: 04/15/24  2:40 PM  Result Value Ref Range   Lyme Total  Antibody EIA Negative Negative    Comment: Lyme antibodies not detected. Reflex testing is not indicated. No laboratory evidence of infection with B. burgdorferi (Lyme disease). Negative results may occur in patients recently infected (less than or equal to 14 days) with B. burgdorferi.  If recent infection is suspected, repeat testing on a new sample collected in 7 to 14 days is recommended.   Spotted Fever Group Antibodies     Status: None   Collection Time: 04/15/24  2:40 PM  Result Value Ref Range   Spotted Fever Group IgG <1:64 Neg:<1:64   Spotted Fever Group IgM <1:64 Neg:<1:64   Result Comment Comment     Comment: Spotted Fever Group IgG serum endpoint titer of >=1:64 is suggestive of infection at an unknown time and may be a sign of either past infection or early response to a recent infection. Spotted Fever Group IgM titer of >=1:64 is regarded as probable evidence of recent or ongoing infection. A four-fold or greater increase in titer between two serum samples drawn 1-2 weeks apart and tested in parallel is the best serologic indicator of a recent rickettsial infection.   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 Note: Due to random X-chromosome inactivation, these results may not reflect Fabry disease carrier status in females.    Director Review Comment     Comment: Catherin Farr, PhD Director, Biochemical Genetics To discuss these results or other testing for inborn errors of metabolism, please contact our Biochemical Geneticists at [phone] [id number]), [facility], [place], [place].     Methodology Comment     Comment: Alpha-galactosidase activity was measured against the artificial substrate 4-methylumbelliferyl-alpha-D-galactopyranoside by the method of Desnick RJ, et al. [address] [place], Desnick SJ, Raman MK, [place] NOSE, [place] [employer], [phone])    Disclaimer: Comment     Comment: This test was developed and its performance characteristics determined by [facility]. It has not been cleared or approved by the Food and Drug Administration.  Assessment & Plan:  Adjust doses of her medications.  Encouraged increased water intake. Problem List Items Addressed This Visit     Esophageal reflux   Relevant Medications   sucralfate (CARAFATE) 1 g tablet   dicyclomine  (BENTYL ) 10 MG capsule   linaclotide  (LINZESS ) 72 MCG capsule   Dyslipidemia associated with type 2 diabetes mellitus (HCC)   Hypertension associated with diabetes (HCC)   Irritable bowel syndrome with constipation - Primary   Relevant Medications   sucralfate (CARAFATE) 1 g tablet   dicyclomine  (BENTYL ) 10 MG capsule   linaclotide  (LINZESS ) 72 MCG capsule   Other Visit Diagnoses       Type 2 diabetes mellitus without complication, without long-term current use of insulin (HCC)           Follow up 2 weeks.   Total time spent: 30 minutes  FERNAND FREDY RAMAN, MD  05/01/2024   This document may have been prepared by [facility] Voice Recognition software and as such may include unintentional dictation errors.

## 2024-05-08 ENCOUNTER — Telehealth: Payer: Self-pay | Admitting: Internal Medicine

## 2024-05-08 ENCOUNTER — Other Ambulatory Visit: Payer: Self-pay | Admitting: Internal Medicine

## 2024-05-08 DIAGNOSIS — J0100 Acute maxillary sinusitis, unspecified: Secondary | ICD-10-CM

## 2024-05-08 MED ORDER — AMOXICILLIN-POT CLAVULANATE 875-125 MG PO TABS: 1.0000 | ORAL_TABLET | Freq: Two times a day (BID) | ORAL | 0 refills | Status: DC

## 2024-05-08 NOTE — Telephone Encounter (Signed)
 Patient called in c/o coughing up yellow phlegm and terrible headache. Took a home Covid test and it was negative. Symptoms started Tuesday. Has been taking Mucinex (blue box). She believes she has a sinus infection and would like an antibiotic sent in. Please advise.  [employer]

## 2024-05-12 ENCOUNTER — Ambulatory Visit
Admission: RE | Admit: 2024-05-12 | Discharge: 2024-05-12 | Disposition: A | Discharge status: Home / Self Care | Attending: Internal Medicine | Admitting: Internal Medicine

## 2024-05-12 ENCOUNTER — Other Ambulatory Visit: Payer: Self-pay | Admitting: Internal Medicine

## 2024-05-12 ENCOUNTER — Ambulatory Visit
Admission: RE | Admit: 2024-05-12 | Discharge: 2024-05-12 | Disposition: A | Discharge status: Home / Self Care | Source: Ambulatory Visit | Attending: Internal Medicine | Admitting: Internal Medicine

## 2024-05-12 ENCOUNTER — Telehealth: Payer: Self-pay | Admitting: Internal Medicine

## 2024-05-12 DIAGNOSIS — R058 Other specified cough: Secondary | ICD-10-CM

## 2024-05-12 NOTE — Telephone Encounter (Signed)
 Patient called in stating she can hear a rattle in her chest and she is coughing up a lot of yellow phlegm. Says she has done 2 home Covid tests that were both negative. Wants to know if you can order for her to go have a chest x-ray done.

## 2024-05-13 ENCOUNTER — Telehealth: Payer: Self-pay | Admitting: Internal Medicine

## 2024-05-13 NOTE — Telephone Encounter (Signed)
 Patient left VM stating when she went for her x-ray yesterday they told her that we could call today and get the results so that she doesn't have to wait 3 days to get them.   It looks like the results are not in MyChart yet.

## 2024-05-15 ENCOUNTER — Encounter: Payer: Self-pay | Admitting: Internal Medicine

## 2024-05-15 ENCOUNTER — Ambulatory Visit: Payer: Self-pay | Admitting: Internal Medicine

## 2024-05-15 ENCOUNTER — Ambulatory Visit (INDEPENDENT_AMBULATORY_CARE_PROVIDER_SITE_OTHER): Admitting: Internal Medicine

## 2024-05-15 VITALS — BP 118/78 | HR 82 | Ht 64.0 in | Wt 209.0 lb

## 2024-05-15 DIAGNOSIS — I152 Hypertension secondary to endocrine disorders: Secondary | ICD-10-CM

## 2024-05-15 DIAGNOSIS — K581 Irritable bowel syndrome with constipation: Secondary | ICD-10-CM | POA: Diagnosis not present

## 2024-05-15 DIAGNOSIS — E119 Type 2 diabetes mellitus without complications: Secondary | ICD-10-CM | POA: Insufficient documentation

## 2024-05-15 DIAGNOSIS — J301 Allergic rhinitis due to pollen: Secondary | ICD-10-CM | POA: Diagnosis not present

## 2024-05-15 DIAGNOSIS — E1159 Type 2 diabetes mellitus with other circulatory complications: Secondary | ICD-10-CM | POA: Diagnosis not present

## 2024-05-15 DIAGNOSIS — J0100 Acute maxillary sinusitis, unspecified: Secondary | ICD-10-CM | POA: Insufficient documentation

## 2024-05-15 DIAGNOSIS — R911 Solitary pulmonary nodule: Secondary | ICD-10-CM

## 2024-05-15 LAB — POCT CBG (FASTING - GLUCOSE)-MANUAL ENTRY: Glucose Fasting, POC: 84 mg/dL (ref 70–99)

## 2024-05-15 MED ORDER — METHYLPREDNISOLONE 4 MG PO TBPK: ORAL_TABLET | ORAL | 0 refills | Status: DC

## 2024-05-15 MED ORDER — FLUTICASONE PROPIONATE 50 MCG/ACT NA SUSP
1.0000 | Freq: Every day | NASAL | 3 refills | Status: AC
Start: 2024-05-15 — End: ?

## 2024-05-15 NOTE — Progress Notes (Signed)
 Established Patient Office Visit  Subjective:  Patient ID: Penny Myers, female    DOB: Jun 03, 1971  Age: 53 y.o. MRN: [id number]  Chief Complaint  Patient presents with   Follow-up    2 week follow up    Patient is here today for her Linzess  and recent acute sinusitis follow up. She reports she has been taking her Augmentin  as prescribed and has 1 day left of medication. She endorses mild improvement with her symptoms but they have not completely resolved. She has complaints of sinus pressure and congestion and frequent productive cough with yellow sputum. Denies fever, chills, nausea, vomiting, diarrhea. Denies chest pain and shortness of breath. Her chest xray for cough was normal. She has been taking Claritin 10 mg daily, Mucinex daily, using Nasal Lavages daily, and Flonase  nasal spray for symptom relief. She has had two negative at home covid tests. Will add medrol  dose pk for residual congestion.  She endorses that the lower dose of Linzess  has improved her IBS symptoms. She has her mammogram screening scheduled for next week. She was referred for a chest CT on 03/24/24 to follow up on incidental lung nodule finding from an abdominal CT but has not gotten any phone calls to schedule an appointment. Will provide phone number with patient to reach out and schedule imaging.     No other concerns at this time.   Past Medical History:  Diagnosis Date   Acid reflux    Anemia    Anxiety    Atypical chest pain 07/02/2015   Note: Unchanged Note: Unchanged   Family history of malignant neoplasm 09/28/2015   Note: Unchanged   Heart murmur    Hypertension    States that she does not have HBP, but takes losartan for leaking valve.   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HCC)    Migraine headache    Sleep apnea    APAP   Type 2 diabetes mellitus (HCC)    Wears contact lenses     Past Surgical History:  Procedure Laterality Date   APPENDECTOMY     CHOLECYSTECTOMY      ESOPHAGOGASTRODUODENOSCOPY N/A 01/08/2024   Procedure: EGD (ESOPHAGOGASTRODUODENOSCOPY);  Surgeon: Unk Corinn Skiff, MD;  Location: [facility];  Service: Endoscopy;  Laterality: N/A;   SHOULDER ARTHROSCOPY WITH SUBACROMIAL DECOMPRESSION Left 12/09/2015   Procedure: SHOULDER ARTHROSCOPY WITH SUBACROMIAL DECOMPRESSION;  Surgeon: Toribio JULIANNA Chancy, MD;  Location: [facility];  Service: Orthopedics;  Laterality: Left;   TUBAL LIGATION      Social History   Socioeconomic History   Marital status: Married    Spouse name: Not on file   Number of children: Not on file   Years of education: Not on file   Highest education level: Not on file  Occupational History   Not on file  Tobacco Use   Smoking status: Every Day    Current packs/day: 0.30    Average packs/day: 0.3 packs/day for 36.7 years (11.0 ttl pk-yrs)    Types: Cigarettes    Start date: 1989   Smokeless tobacco: Never  Vaping Use   Vaping status: Never Used  Substance and Sexual Activity   Alcohol use: Yes    Comment: occ   Drug use: Yes    Types: Marijuana   Sexual activity: Not on file  Other Topics Concern   Not on file  Social History Narrative   Not on file   Social Drivers of Health   Financial Resource Strain: Not on  file  Food Insecurity: Not on file  Transportation Needs: Not on file  Physical Activity: Not on file  Stress: Not on file  Social Connections: Not on file  Intimate Partner Violence: Not on file    Family History  Problem Relation Age of Onset   Cancer Maternal Aunt    Breast cancer Maternal Aunt        40's   Gastric cancer Maternal Grandmother     No Known Allergies  Outpatient Medications Prior to Visit  Medication Sig   amoxicillin -clavulanate (AUGMENTIN ) 875-125 MG tablet Take 1 tablet by mouth 2 (two) times daily.   celecoxib  (CELEBREX ) 200 MG capsule Take 1 capsule (200 mg total) by mouth daily.   cyclobenzaprine  (FLEXERIL ) 10 MG tablet Take 10 mg by mouth  3 (three) times daily as needed.   dicyclomine  (BENTYL ) 10 MG capsule Take 1 capsule (10 mg total) by mouth 4 (four) times daily -  before meals and at bedtime.   EMGALITY 120 MG/ML SOAJ Inject into the skin.   hydrochlorothiazide (MICROZIDE) 12.5 MG capsule TAKE ONE CAPSULE BY MOUTH ONE TIME DAILY IF BLOOD PRESSURE IS ELEVATED   JARDIANCE  25 MG TABS tablet TAKE ONE TABLET BY MOUTH ONE TIME DAILY BEFORE BREAKFAST   linaclotide  (LINZESS ) 72 MCG capsule Take 1 capsule (72 mcg total) by mouth daily before breakfast.   loratadine (CLARITIN) 10 MG tablet Take by mouth.   losartan (COZAAR) 100 MG tablet TAKE ONE TABLET BY MOUTH ONE TIME DAILY   sucralfate (CARAFATE) 1 g tablet Take 1 g by mouth in the morning, at noon, and at bedtime.   venlafaxine (EFFEXOR) 37.5 MG tablet Take 37.5 mg by mouth.   [DISCONTINUED] fluticasone  (FLONASE ) 50 MCG/ACT nasal spray Place 1 spray into both nostrils daily.   diclofenac Sodium (VOLTAREN) 1 % GEL Voltaren 1% External Gel QTY: 50 gram Days: 30 Refills: 2  Written: 06/30/21 Patient Instructions: apply bid to the area for 2 weks (Patient not taking: Reported on 05/15/2024)   No facility-administered medications prior to visit.    Review of Systems  Constitutional: Negative.   HENT:  Positive for congestion.   Eyes: Negative.   Respiratory:  Positive for cough, sputum production and wheezing (reports it just at night while laying down). Negative for shortness of breath.   Cardiovascular: Negative.  Negative for chest pain, palpitations and leg swelling.  Gastrointestinal: Negative.  Negative for abdominal pain, constipation, diarrhea, heartburn, nausea and vomiting.  Genitourinary: Negative.  Negative for dysuria and flank pain.  Musculoskeletal: Negative.  Negative for joint pain and myalgias.  Skin: Negative.   Neurological: Negative.  Negative for dizziness and headaches.  Endo/Heme/Allergies: Negative.   Psychiatric/Behavioral: Negative.  Negative for  depression and suicidal ideas. The patient is not nervous/anxious.        Objective:   BP 118/78   Pulse 82   Ht 5' 4 (1.626 m)   Wt 209 lb (94.8 kg)   SpO2 99%   BMI 35.87 kg/m   Vitals:   05/15/24 1424  BP: 118/78  Pulse: 82  Height: 5' 4 (1.626 m)  Weight: 209 lb (94.8 kg)  SpO2: 99%  BMI (Calculated): 35.86    Physical Exam Vitals and nursing note reviewed.  Constitutional:      Appearance: Normal appearance.  HENT:     Head: Normocephalic and atraumatic.     Nose: Nose normal.     Mouth/Throat:     Mouth: Mucous membranes are moist.  Pharynx: Oropharynx is clear.  Eyes:     Conjunctiva/sclera: Conjunctivae normal.     Pupils: Pupils are equal, round, and reactive to light.  Cardiovascular:     Rate and Rhythm: Normal rate and regular rhythm.     Pulses: Normal pulses.     Heart sounds: Normal heart sounds. No murmur heard. Pulmonary:     Effort: Pulmonary effort is normal.     Breath sounds: Normal breath sounds. No wheezing.  Abdominal:     General: Bowel sounds are normal.     Palpations: Abdomen is soft.     Tenderness: There is no abdominal tenderness. There is no right CVA tenderness or left CVA tenderness.  Musculoskeletal:        General: Normal range of motion.     Cervical back: Normal range of motion.     Right lower leg: No edema.     Left lower leg: No edema.  Skin:    General: Skin is warm and dry.  Neurological:     General: No focal deficit present.     Mental Status: She is alert and oriented to person, place, and time.  Psychiatric:        Mood and Affect: Mood normal.        Behavior: Behavior normal.      Results for orders placed or performed in visit on 05/15/24  POCT CBG (Fasting - Glucose)  Result Value Ref Range   Glucose Fasting, POC 84 70 - 99 mg/dL    Recent Results (from the past 2160 hours)  POC CREATINE & ALBUMIN,URINE     Status: None   Collection Time: 03/24/24  2:07 PM  Result Value Ref Range    Microalbumin Ur, POC 30 mg/L   Creatinine, POC 300 mg/dL   Albumin/Creatinine Ratio, Urine, POC <30   Hemoglobin A1c     Status: Abnormal   Collection Time: 03/24/24  2:23 PM  Result Value Ref Range   Hgb A1c MFr Bld 6.0 (H) 4.8 - 5.6 %    Comment:          Prediabetes: 5.7 - 6.4          Diabetes: >6.4          Glycemic control for adults with diabetes: <7.0    Est. average glucose Bld gHb Est-mCnc 126 mg/dL  Lipid Panel w/o Chol/HDL Ratio     Status: None   Collection Time: 03/24/24  2:23 PM  Result Value Ref Range   Cholesterol, Total 132 100 - 199 mg/dL   Triglycerides 64 0 - 149 mg/dL   HDL 51 >60 mg/dL   VLDL Cholesterol Cal 13 5 - 40 mg/dL   LDL Chol Calc (NIH) 68 0 - 99 mg/dL  [id number]     Status: Abnormal   Collection Time: 03/24/24  2:23 PM  Result Value Ref Range   Glucose 94 70 - 99 mg/dL   BUN 15 6 - 24 mg/dL   Creatinine, Ser 9.09 0.57 - 1.00 mg/dL   eGFR 76 >40 fO/fpw/8.26   BUN/Creatinine Ratio 17 9 - 23   Sodium 144 134 - 144 mmol/L   Potassium 4.1 3.5 - 5.2 mmol/L   Chloride 109 (H) 96 - 106 mmol/L   CO2 20 20 - 29 mmol/L   Calcium 9.4 8.7 - 10.2 mg/dL   Total Protein 6.6 6.0 - 8.5 g/dL   Albumin 4.3 3.8 - 4.9 g/dL   Globulin, Total 2.3 1.5 - 4.5 g/dL  Bilirubin Total 0.4 0.0 - 1.2 mg/dL   Alkaline Phosphatase 91 44 - 121 IU/L   AST 18 0 - 40 IU/L   ALT 17 0 - 32 IU/L  CBC with Diff     Status: None   Collection Time: 03/24/24  2:23 PM  Result Value Ref Range   WBC 8.0 3.4 - 10.8 x10E3/uL   RBC 5.03 3.77 - 5.28 x10E6/uL   Hemoglobin 14.4 11.1 - 15.9 g/dL   Hematocrit 54.7 65.9 - 46.6 %   MCV 90 79 - 97 fL   MCH 28.6 26.6 - 33.0 pg   MCHC 31.9 31.5 - 35.7 g/dL   RDW 86.8 88.2 - 84.5 %   Platelets 218 150 - 450 x10E3/uL   Neutrophils 58 Not Estab. %   Lymphs 35 Not Estab. %   Monocytes 5 Not Estab. %   Eos 1 Not Estab. %   Basos 1 Not Estab. %   Neutrophils Absolute 4.7 1.4 - 7.0 x10E3/uL   Lymphocytes Absolute 2.7 0.7 - 3.1 x10E3/uL    Monocytes Absolute 0.4 0.1 - 0.9 x10E3/uL   EOS (ABSOLUTE) 0.1 0.0 - 0.4 x10E3/uL   Basophils Absolute 0.0 0.0 - 0.2 x10E3/uL   Immature Granulocytes 0 Not Estab. %   Immature Grans (Abs) 0.0 0.0 - 0.1 x10E3/uL  Lyme Disease Serology w/Reflex     Status: None   Collection Time: 04/15/24  2:40 PM  Result Value Ref Range   Lyme Total Antibody EIA Negative Negative    Comment: Lyme antibodies not detected. Reflex testing is not indicated. No laboratory evidence of infection with B. burgdorferi (Lyme disease). Negative results may occur in patients recently infected (less than or equal to 14 days) with B. burgdorferi.  If recent infection is suspected, repeat testing on a new sample collected in 7 to 14 days is recommended.   Spotted Fever Group Antibodies     Status: None   Collection Time: 04/15/24  2:40 PM  Result Value Ref Range   Spotted Fever Group IgG <1:64 Neg:<1:64   Spotted Fever Group IgM <1:64 Neg:<1:64   Result Comment Comment     Comment: Spotted Fever Group IgG serum endpoint titer of >=1:64 is suggestive of infection at an unknown time and may be a sign of either past infection or early response to a recent infection. Spotted Fever Group IgM titer of >=1:64 is regarded as probable evidence of recent or ongoing infection. A four-fold or greater increase in titer between two serum samples drawn 1-2 weeks apart and tested in parallel is the best serologic indicator of a recent rickettsial infection.   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 Note: Due to random X-chromosome inactivation, these  results may not reflect Fabry disease carrier status in females.    Director Review Comment     Comment: Catherin Farr, PhD Director, Biochemical Genetics To discuss these results or other testing for inborn errors of metabolism, please contact our Biochemical Geneticists at [phone] [id number]), [facility], [place], [place].    Methodology Comment     Comment: Alpha-galactosidase activity was measured against the artificial substrate 4-methylumbelliferyl-alpha-D-galactopyranoside by the  method of Desnick RJ, et al. [address], [place], Desnick SJ, Raman MK, [place] NOSE, Krivit Google, 18(7):[phone])    Disclaimer: Comment     Comment: This test was developed and its performance characteristics determined by [facility]. It has not been cleared or approved by the Food and Drug Administration.   POCT CBG (Fasting - Glucose)     Status: None   Collection Time: 05/15/24  2:29 PM  Result Value Ref Range   Glucose Fasting, POC 84 70 - 99 mg/dL      Assessment & Plan:  Continue medications as prescribed. Start Medrol  dosepak. Will check routine labs at next visit. Encouraged rest, healthy diet, exercise, and increased water intake daily. FU if sinusitis and cough do not resolve; regular FU in 3 months Problem List Items Addressed This Visit     Allergic rhinitis   Relevant Medications   fluticasone  (FLONASE ) 50 MCG/ACT nasal spray   Hypertension associated with diabetes (HCC)   Irritable bowel syndrome with constipation   Acute non-recurrent maxillary sinusitis   Relevant Medications   fluticasone  (FLONASE ) 50 MCG/ACT nasal spray   methylPREDNISolone  (MEDROL  DOSEPAK) 4 MG TBPK tablet   Type 2 diabetes mellitus without complication, without long-term current use of insulin (HCC) - Primary   Relevant Orders   POCT CBG (Fasting - Glucose) (Completed)   Other Visit Diagnoses       Lung nodule seen on imaging study       Relevant Orders   CT CHEST LUNG CA SCREEN  LOW DOSE W/O CM       Return in about 3 months (around 08/15/2024).   Total time spent: 25 minutes  FERNAND FREDY RAMAN, MD  05/15/2024   This document may have been prepared by [facility] Voice Recognition software and as such may include unintentional dictation errors.

## 2024-05-15 NOTE — Progress Notes (Signed)
Pt informed

## 2024-05-20 ENCOUNTER — Ambulatory Visit: Payer: Self-pay

## 2024-05-20 ENCOUNTER — Ambulatory Visit
Admission: RE | Admit: 2024-05-20 | Discharge: 2024-05-20 | Disposition: A | Discharge status: Home / Self Care | Source: Ambulatory Visit | Attending: Internal Medicine | Admitting: Internal Medicine

## 2024-05-20 DIAGNOSIS — Z1231 Encounter for screening mammogram for malignant neoplasm of breast: Secondary | ICD-10-CM

## 2024-05-22 ENCOUNTER — Telehealth: Payer: Self-pay

## 2024-05-22 NOTE — Telephone Encounter (Signed)
 Patient called asking for a call back about a refill on what sounds like pantoprazole , have you received somethng for her on this? Can you call her back

## 2024-05-23 ENCOUNTER — Telehealth: Payer: Self-pay | Admitting: Internal Medicine

## 2024-05-23 ENCOUNTER — Other Ambulatory Visit: Payer: Self-pay | Admitting: Internal Medicine

## 2024-05-23 ENCOUNTER — Ambulatory Visit
Admission: RE | Admit: 2024-05-23 | Discharge: 2024-05-23 | Disposition: A | Discharge status: Home / Self Care | Source: Ambulatory Visit | Attending: Internal Medicine | Admitting: Internal Medicine

## 2024-05-23 ENCOUNTER — Ambulatory Visit

## 2024-05-23 ENCOUNTER — Telehealth: Payer: Self-pay

## 2024-05-23 DIAGNOSIS — K219 Gastro-esophageal reflux disease without esophagitis: Secondary | ICD-10-CM

## 2024-05-23 DIAGNOSIS — R911 Solitary pulmonary nodule: Secondary | ICD-10-CM | POA: Diagnosis present

## 2024-05-23 MED ORDER — PANTOPRAZOLE SODIUM 40 MG PO TBEC: 40.0000 mg | DELAYED_RELEASE_TABLET | Freq: Every day | ORAL | 3 refills | Status: DC

## 2024-05-23 NOTE — Telephone Encounter (Signed)
 Pt called and left vm requesting a refill on rx pantoprazole  40mg , but I didn't see on current med list, but it is listed in her history. Please advise

## 2024-05-23 NOTE — Telephone Encounter (Signed)
 Pt called very irate & disrepectful stating  I have had enough of this & I need my medicine, once pt was done speaking I let her know that her pantoprazole  was at [facility] with 3 refills. Pt proceeded to hang up.

## 2024-05-23 NOTE — Telephone Encounter (Signed)
 Spoke with the patient and informed her that it looked like the request had been sent to a previous prescribing provider but that doctor no longer is writing for the medication, she appoligized for being so ugly on the phone

## 2024-05-28 ENCOUNTER — Encounter: Payer: Self-pay | Admitting: Family

## 2024-05-28 ENCOUNTER — Ambulatory Visit (INDEPENDENT_AMBULATORY_CARE_PROVIDER_SITE_OTHER): Admitting: Family

## 2024-05-28 VITALS — BP 102/74 | HR 84 | Ht 64.0 in | Wt 206.8 lb

## 2024-05-28 DIAGNOSIS — G8929 Other chronic pain: Secondary | ICD-10-CM

## 2024-05-28 DIAGNOSIS — R222 Localized swelling, mass and lump, trunk: Secondary | ICD-10-CM

## 2024-05-28 DIAGNOSIS — M25512 Pain in left shoulder: Secondary | ICD-10-CM

## 2024-05-28 NOTE — Progress Notes (Unsigned)
 Established Patient Office Visit  Subjective:  Patient ID: Penny Myers, female    DOB: April 22, 1971  Age: 53 y.o. MRN: [id number]  Chief Complaint  Patient presents with   Acute Visit    Having shoulder pain and noticeable knot    Patient is here today with pain in her left shoulder.   This pain has been recurrent, but it is worsening over time.  She also has a lump on her shoulder that has been getting larger.  It is located at the lower interior corner of her left scapula.   The lump does not hurt but it is tender to palpation.   She has been to see orthopedics, where she did have xrays and physical therapy, but has not had a lot of benefit from this, so she asks if we can get an MRI set up for her.   No other concerns today.   Past Medical History:  Diagnosis Date   Acid reflux    Anemia    Anxiety    Atypical chest pain 07/02/2015   Note: Unchanged Note: Unchanged   Family history of malignant neoplasm 09/28/2015   Note: Unchanged   Heart murmur    Hypertension    States that she does not have HBP, but takes losartan for leaking valve.   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HCC)    Migraine headache    Sleep apnea    APAP   Type 2 diabetes mellitus (HCC)    Wears contact lenses     Past Surgical History:  Procedure Laterality Date   APPENDECTOMY     CHOLECYSTECTOMY     ESOPHAGOGASTRODUODENOSCOPY N/A 01/08/2024   Procedure: EGD (ESOPHAGOGASTRODUODENOSCOPY);  Surgeon: Unk Corinn Skiff, MD;  Location: [facility];  Service: Endoscopy;  Laterality: N/A;   SHOULDER ARTHROSCOPY WITH SUBACROMIAL DECOMPRESSION Left 12/09/2015   Procedure: SHOULDER ARTHROSCOPY WITH SUBACROMIAL DECOMPRESSION;  Surgeon: Toribio JULIANNA Chancy, MD;  Location: [facility];  Service: Orthopedics;  Laterality: Left;   TUBAL LIGATION      Social History   Socioeconomic History   Marital status: Married    Spouse name: Not on file   Number of children: Not on file   Years of  education: Not on file   Highest education level: Not on file  Occupational History   Not on file  Tobacco Use   Smoking status: Every Day    Current packs/day: 0.30    Average packs/day: 0.3 packs/day for 36.7 years (11.0 ttl pk-yrs)    Types: Cigarettes    Start date: 1989   Smokeless tobacco: Never  Vaping Use   Vaping status: Never Used  Substance and Sexual Activity   Alcohol use: Yes    Comment: occ   Drug use: Yes    Types: Marijuana   Sexual activity: Not on file  Other Topics Concern   Not on file  Social History Narrative   Not on file   Social Drivers of Health   Financial Resource Strain: Not on file  Food Insecurity: Not on file  Transportation Needs: Not on file  Physical Activity: Not on file  Stress: Not on file  Social Connections: Not on file  Intimate Partner Violence: Not on file    Family History  Problem Relation Age of Onset   Cancer Maternal Aunt    Breast cancer Maternal Aunt 40 - 49       recurred   Gastric cancer Maternal Grandmother     No Known  Allergies  Review of Systems  Musculoskeletal:  Positive for joint pain.       Arm pain  Skin:        Lump on left shoulder  All other systems reviewed and are negative.      Objective:   BP 102/74   Pulse 84   Ht 5' 4 (1.626 m)   Wt 206 lb 12.8 oz (93.8 kg)   SpO2 97%   BMI 35.50 kg/m   Vitals:   05/28/24 1116  BP: 102/74  Pulse: 84  Height: 5' 4 (1.626 m)  Weight: 206 lb 12.8 oz (93.8 kg)  SpO2: 97%  BMI (Calculated): 35.48    Physical Exam Vitals and nursing note reviewed.  Constitutional:      Appearance: Normal appearance. She is normal weight.  HENT:     Head: Normocephalic.  Eyes:     Extraocular Movements: Extraocular movements intact.     Conjunctiva/sclera: Conjunctivae normal.     Pupils: Pupils are equal, round, and reactive to light.  Cardiovascular:     Rate and Rhythm: Normal rate.  Pulmonary:     Effort: Pulmonary effort is normal.   Musculoskeletal:     Left shoulder: Swelling, tenderness and crepitus present. Decreased range of motion.       Arms:  Neurological:     General: No focal deficit present.     Mental Status: She is alert and oriented to person, place, and time. Mental status is at baseline.  Psychiatric:        Mood and Affect: Mood normal.        Behavior: Behavior normal.        Thought Content: Thought content normal.        Judgment: Judgment normal.      No results found for any visits on 05/28/24.  Recent Results (from the past 2160 hours)  POC CREATINE & ALBUMIN,URINE     Status: None   Collection Time: 03/24/24  2:07 PM  Result Value Ref Range   Microalbumin Ur, POC 30 mg/L   Creatinine, POC 300 mg/dL   Albumin/Creatinine Ratio, Urine, POC <30   Hemoglobin A1c     Status: Abnormal   Collection Time: 03/24/24  2:23 PM  Result Value Ref Range   Hgb A1c MFr Bld 6.0 (H) 4.8 - 5.6 %    Comment:          Prediabetes: 5.7 - 6.4          Diabetes: >6.4          Glycemic control for adults with diabetes: <7.0    Est. average glucose Bld gHb Est-mCnc 126 mg/dL  Lipid Panel w/o Chol/HDL Ratio     Status: None   Collection Time: 03/24/24  2:23 PM  Result Value Ref Range   Cholesterol, Total 132 100 - 199 mg/dL   Triglycerides 64 0 - 149 mg/dL   HDL 51 >60 mg/dL   VLDL Cholesterol Cal 13 5 - 40 mg/dL   LDL Chol Calc (NIH) 68 0 - 99 mg/dL  RFE85+ZHQM     Status: Abnormal   Collection Time: 03/24/24  2:23 PM  Result Value Ref Range   Glucose 94 70 - 99 mg/dL   BUN 15 6 - 24 mg/dL   Creatinine, Ser 9.09 0.57 - 1.00 mg/dL   eGFR 76 >40 fO/fpw/8.26   BUN/Creatinine Ratio 17 9 - 23   Sodium 144 134 - 144 mmol/L   Potassium 4.1 3.5 -  5.2 mmol/L   Chloride 109 (H) 96 - 106 mmol/L   CO2 20 20 - 29 mmol/L   Calcium 9.4 8.7 - 10.2 mg/dL   Total Protein 6.6 6.0 - 8.5 g/dL   Albumin 4.3 3.8 - 4.9 g/dL   Globulin, Total 2.3 1.5 - 4.5 g/dL   Bilirubin Total 0.4 0.0 - 1.2 mg/dL   Alkaline  Phosphatase 91 44 - 121 IU/L   AST 18 0 - 40 IU/L   ALT 17 0 - 32 IU/L  CBC with Diff     Status: None   Collection Time: 03/24/24  2:23 PM  Result Value Ref Range   WBC 8.0 3.4 - 10.8 x10E3/uL   RBC 5.03 3.77 - 5.28 x10E6/uL   Hemoglobin 14.4 11.1 - 15.9 g/dL   Hematocrit 54.7 65.9 - 46.6 %   MCV 90 79 - 97 fL   MCH 28.6 26.6 - 33.0 pg   MCHC 31.9 31.5 - 35.7 g/dL   RDW 86.8 88.2 - 84.5 %   Platelets 218 150 - 450 x10E3/uL   Neutrophils 58 Not Estab. %   Lymphs 35 Not Estab. %   Monocytes 5 Not Estab. %   Eos 1 Not Estab. %   Basos 1 Not Estab. %   Neutrophils Absolute 4.7 1.4 - 7.0 x10E3/uL   Lymphocytes Absolute 2.7 0.7 - 3.1 x10E3/uL   Monocytes Absolute 0.4 0.1 - 0.9 x10E3/uL   EOS (ABSOLUTE) 0.1 0.0 - 0.4 x10E3/uL   Basophils Absolute 0.0 0.0 - 0.2 x10E3/uL   Immature Granulocytes 0 Not Estab. %   Immature Grans (Abs) 0.0 0.0 - 0.1 x10E3/uL  Lyme Disease Serology w/Reflex     Status: None   Collection Time: 04/15/24  2:40 PM  Result Value Ref Range   Lyme Total Antibody EIA Negative Negative    Comment: Lyme antibodies not detected. Reflex testing is not indicated. No laboratory evidence of infection with B. burgdorferi (Lyme disease). Negative results may occur in patients recently infected (less than or equal to 14 days) with B. burgdorferi.  If recent infection is suspected, repeat testing on a new sample collected in 7 to 14 days is recommended.   Spotted Fever Group Antibodies     Status: None   Collection Time: 04/15/24  2:40 PM  Result Value Ref Range   Spotted Fever Group IgG <1:64 Neg:<1:64   Spotted Fever Group IgM <1:64 Neg:<1:64   Result Comment Comment     Comment: Spotted Fever Group IgG serum endpoint titer of >=1:64 is suggestive of infection at an unknown time and may be a sign of either past infection or early response to a recent infection. Spotted Fever Group IgM titer of >=1:64 is regarded as probable evidence of recent or ongoing infection.  A four-fold or greater increase in titer between two serum samples drawn 1-2 weeks apart and tested in parallel is the best serologic indicator of a recent rickettsial infection.   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 Note: Due to random X-chromosome inactivation, these results may not reflect Fabry disease carrier status in females.    Director Review Comment  Comment: Catherin Farr, PhD Director, Biochemical Genetics To discuss these results or other testing for inborn errors of metabolism, please contact our Biochemical Geneticists at [phone] HZWZ([id number]), [facility], RTP, [place].    Methodology Comment     Comment: Alpha-galactosidase activity was measured against the artificial substrate 4-methylumbelliferyl-alpha-D-galactopyranoside by the method of Desnick RJ, et al. [address] [place], Desnick SJ, Raman MK, Karolynn NOSE, Krivit Google, [phone])    Disclaimer: Comment     Comment: This test was developed and its performance characteristics determined by [facility]. It has not been cleared or approved by the Food and Drug Administration.   POCT CBG (Fasting - Glucose)     Status: None   Collection Time: 05/15/24  2:29 PM  Result Value Ref Range   Glucose Fasting, POC 84 70 - 99 mg/dL       Assessment & Plan Chronic left shoulder pain Lump of skin of back Given that patient has done Physical therapy and has had X-rays without any significant improvement, I would like to get an MRI of her shoulder.   We will get records from [facility] and once we have them,  will get her approved for her MRI;  I have explained to pt that the insurance company will most likely require the documentation we are waiting for from them to get approval.   Will await results and contact patient for follow up once completed.     Return once we have results of MRI.[email or web address]   Total time spent: 20 minutes  ALAN CHRISTELLA ARRANT, FNP  05/28/2024   This document may have been prepared by [facility] Voice Recognition software and as such may include unintentional dictation errors.

## 2024-05-29 ENCOUNTER — Ambulatory Visit: Admitting: Internal Medicine

## 2024-05-29 ENCOUNTER — Telehealth: Payer: Self-pay | Admitting: Internal Medicine

## 2024-05-29 NOTE — Telephone Encounter (Signed)
 Patient left VM wanting a call back for questions about an MRI. Alan did order an MRI when she was here yesterday but it hasn't been done yet.

## 2024-06-01 ENCOUNTER — Encounter: Payer: Self-pay | Admitting: Family

## 2024-06-02 ENCOUNTER — Ambulatory Visit: Admission: RE | Admit: 2024-06-02 | Source: Ambulatory Visit

## 2024-06-03 ENCOUNTER — Telehealth: Payer: Self-pay | Admitting: Internal Medicine

## 2024-06-03 NOTE — Telephone Encounter (Signed)
 Patient left VM stating that Penny Myers couldn't make out the medical records request. It wasn't clear on fax and it needs to be recompleted and sent back to them. This is holding up her getting her MRI approved.

## 2024-06-03 NOTE — Telephone Encounter (Signed)
 Pt called stating that the doc office that is needing the xray report needs to have another request faxed to them because  they cannot read or understand what Penny Myers is requesting

## 2024-06-04 ENCOUNTER — Ambulatory Visit: Admitting: Family

## 2024-06-06 NOTE — Telephone Encounter (Signed)
 See other task

## 2024-06-13 ENCOUNTER — Other Ambulatory Visit

## 2024-06-20 ENCOUNTER — Ambulatory Visit
Admission: RE | Admit: 2024-06-20 | Discharge: 2024-06-20 | Disposition: A | Discharge status: Home / Self Care | Source: Ambulatory Visit | Attending: Family | Admitting: Family

## 2024-06-20 DIAGNOSIS — G8929 Other chronic pain: Secondary | ICD-10-CM

## 2024-06-29 ENCOUNTER — Other Ambulatory Visit: Payer: Self-pay | Admitting: Internal Medicine

## 2024-06-29 DIAGNOSIS — E119 Type 2 diabetes mellitus without complications: Secondary | ICD-10-CM

## 2024-07-01 ENCOUNTER — Other Ambulatory Visit: Payer: Self-pay

## 2024-07-01 DIAGNOSIS — E119 Type 2 diabetes mellitus without complications: Secondary | ICD-10-CM

## 2024-07-11 ENCOUNTER — Ambulatory Visit: Payer: Self-pay | Admitting: Internal Medicine

## 2024-07-11 ENCOUNTER — Ambulatory Visit (INDEPENDENT_AMBULATORY_CARE_PROVIDER_SITE_OTHER): Admitting: Family

## 2024-07-11 VITALS — BP 108/72 | HR 82 | Ht 64.0 in | Wt 208.0 lb

## 2024-07-11 DIAGNOSIS — R222 Localized swelling, mass and lump, trunk: Secondary | ICD-10-CM | POA: Diagnosis not present

## 2024-07-11 DIAGNOSIS — Z23 Encounter for immunization: Secondary | ICD-10-CM | POA: Diagnosis not present

## 2024-07-11 DIAGNOSIS — Z0130 Encounter for examination of blood pressure without abnormal findings: Secondary | ICD-10-CM

## 2024-07-11 DIAGNOSIS — E1165 Type 2 diabetes mellitus with hyperglycemia: Secondary | ICD-10-CM | POA: Diagnosis not present

## 2024-07-11 DIAGNOSIS — K219 Gastro-esophageal reflux disease without esophagitis: Secondary | ICD-10-CM

## 2024-07-11 DIAGNOSIS — E119 Type 2 diabetes mellitus without complications: Secondary | ICD-10-CM

## 2024-07-11 DIAGNOSIS — K5909 Other constipation: Secondary | ICD-10-CM | POA: Diagnosis not present

## 2024-07-11 LAB — POCT CBG (FASTING - GLUCOSE)-MANUAL ENTRY: Glucose Fasting, POC: 110 mg/dL — AB (ref 70–99)

## 2024-07-11 MED ORDER — PANTOPRAZOLE SODIUM 40 MG PO TBEC: 40.0000 mg | DELAYED_RELEASE_TABLET | Freq: Two times a day (BID) | ORAL | 3 refills | Status: AC

## 2024-07-11 MED ORDER — IPRATROPIUM BROMIDE 0.06 % NA SOLN: 1.0000 | Freq: Three times a day (TID) | NASAL | 2 refills | Status: AC

## 2024-07-11 NOTE — Progress Notes (Signed)
 Established Patient Office Visit  Subjective:  Patient ID: Penny Myers, female    DOB: Mar 24, 1971  Age: 53 y.o. MRN: [id number]  Chief Complaint  Patient presents with   Follow-up    discuss MRI    Patient is here today for her follow up.  She has been feeling fairly well since last appointment.   She does have additional concerns to discuss today.  She had her MRI, which did not show her mass, but did find a cyst above her clavicle.  She says that the Tech told her it would not get the area we were concerned about, and that they had specific instructions for what to put in the order to get it correct.  Also would like to see Dr. Unk at [facility], as she was previously established and asks if we can send a referral.   Labs are not due today.  She needs refills.   I have reviewed her active problem list, medication list, allergies, notes from last encounter, lab results for her appointment today.      No other concerns at this time.   Past Medical History:  Diagnosis Date   Acid reflux    Anemia    Anxiety    Atypical chest pain 07/02/2015   Note: Unchanged Note: Unchanged   Family history of malignant neoplasm 09/28/2015   Note: Unchanged   Heart murmur    Hypertension    States that she does not have HBP, but takes losartan for leaking valve.   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HCC)    Migraine headache    Sleep apnea    APAP   Type 2 diabetes mellitus (HCC)    Wears contact lenses     Past Surgical History:  Procedure Laterality Date   APPENDECTOMY     CHOLECYSTECTOMY     ESOPHAGOGASTRODUODENOSCOPY N/A 01/08/2024   Procedure: EGD (ESOPHAGOGASTRODUODENOSCOPY);  Surgeon: Unk Corinn Skiff, MD;  Location: [facility];  Service: Endoscopy;  Laterality: N/A;   SHOULDER ARTHROSCOPY WITH SUBACROMIAL DECOMPRESSION Left 12/09/2015   Procedure: SHOULDER ARTHROSCOPY WITH SUBACROMIAL DECOMPRESSION;  Surgeon: Toribio JULIANNA Chancy, MD;  Location: [facility];  Service: Orthopedics;  Laterality: Left;   TUBAL LIGATION      Social History   Socioeconomic History   Marital status: Married    Spouse name: Not on file   Number of children: Not on file   Years of education: Not on file   Highest education level: Not on file  Occupational History   Not on file  Tobacco Use   Smoking status: Every Day    Current packs/day: 0.30    Average packs/day: 0.3 packs/day for 36.8 years (11.0 ttl pk-yrs)    Types: Cigarettes    Start date: 1989   Smokeless tobacco: Never  Vaping Use   Vaping status: Never Used  Substance and Sexual Activity   Alcohol use: Yes    Comment: occ   Drug use: Yes    Types: Marijuana   Sexual activity: Not on file  Other Topics Concern   Not on file  Social History Narrative   Not on file   Social Drivers of Health   Financial Resource Strain: Not on file  Food Insecurity: Not on file  Transportation Needs: Not on file  Physical Activity: Not on file  Stress: Not on file  Social Connections: Not on file  Intimate Partner Violence: Not on file    Family History  Problem Relation  Age of Onset   Cancer Maternal Aunt    Breast cancer Maternal Aunt 2 - 49       recurred   Gastric cancer Maternal Grandmother     No Known Allergies  Review of Systems  All other systems reviewed and are negative.      Objective:   Ht 5' 4 (1.626 m)   Wt 208 lb (94.3 kg)   BMI 35.70 kg/m   Vitals:   07/11/24 1346  Height: 5' 4 (1.626 m)  Weight: 208 lb (94.3 kg)  BMI (Calculated): 35.69    Physical Exam Vitals and nursing note reviewed.  Constitutional:      Appearance: Normal appearance. She is normal weight.  HENT:     Head: Normocephalic.  Eyes:     Extraocular Movements: Extraocular movements intact.     Conjunctiva/sclera: Conjunctivae normal.     Pupils: Pupils are equal, round, and reactive to light.  Cardiovascular:     Rate and Rhythm: Normal rate.  Pulmonary:     Effort:  Pulmonary effort is normal.  Neurological:     General: No focal deficit present.     Mental Status: She is alert and oriented to person, place, and time. Mental status is at baseline.  Psychiatric:        Mood and Affect: Mood normal.        Behavior: Behavior normal.        Thought Content: Thought content normal.      No results found for any visits on 07/11/24.  Recent Results (from the past 2160 hours)  Lyme Disease Serology w/Reflex     Status: None   Collection Time: 04/15/24  2:40 PM  Result Value Ref Range   Lyme Total Antibody EIA Negative Negative    Comment: Lyme antibodies not detected. Reflex testing is not indicated. No laboratory evidence of infection with B. burgdorferi (Lyme disease). Negative results may occur in patients recently infected (less than or equal to 14 days) with B. burgdorferi.  If recent infection is suspected, repeat testing on a new sample collected in 7 to 14 days is recommended.   Spotted Fever Group Antibodies     Status: None   Collection Time: 04/15/24  2:40 PM  Result Value Ref Range   Spotted Fever Group IgG <1:64 Neg:<1:64   Spotted Fever Group IgM <1:64 Neg:<1:64   Result Comment Comment     Comment: Spotted Fever Group IgG serum endpoint titer of >=1:64 is suggestive of infection at an unknown time and may be a sign of either past infection or early response to a recent infection. Spotted Fever Group IgM titer of >=1:64 is regarded as probable evidence of recent or ongoing infection. A four-fold or greater increase in titer between two serum samples drawn 1-2 weeks apart and tested in parallel is the best serologic indicator of a recent rickettsial infection.   Alpha galactosidase     Status: None   Collection Time: 04/15/24  2:40 PM  Result Value Ref Range   Alpha-Galactosidase activity 91.6 >35.5 nmol/hr/mg prt   Interpretation Comment     Comment: Unaffected The above enzyme activity is consistent with this patient being  unaffected with alpha-galactosidase A deficiency. Alpha-galactosidase A deficiency is the underlying defect in the X-linked disorder Fabry disease. Note: Due to random X-chromosome inactivation, these results may not reflect Fabry disease carrier status in females.    Director Review Comment     Comment: Catherin Farr, PhD Director, Biochemical Genetics  To discuss these results or other testing for inborn errors of metabolism, please contact our Biochemical Geneticists at [phone] [id number]), [facility], RTP, [place].    Methodology Comment     Comment: Alpha-galactosidase activity was measured against the artificial substrate 4-methylumbelliferyl-alpha-D-galactopyranoside by the method of Desnick RJ, et al. [address] [place], Desnick SJ, Raman MK, Karolynn NOSE, Krivit Google, [phone])    Disclaimer: Comment     Comment: This test was developed and its performance characteristics determined by [facility]. It has not been cleared or approved by the Food and Drug Administration.   POCT CBG (Fasting - Glucose)     Status: None   Collection Time: 05/15/24  2:29 PM  Result Value Ref Range   Glucose Fasting, POC 84 70 - 99 mg/dL       Assessment & Plan Gastroesophageal reflux disease without esophagitis Chronic constipation Setting patient up for referral to gastroenterology .  Will defer to them for further treatment changes.  Reassess at follow up.  Type 2 diabetes mellitus without complication, without long-term current use of insulin (HCC) Continue current diabetes POC, as patient has been well controlled on current regimen.  Will adjust meds if needed based on labs.   Mass of chest wall, left Correct MRI order placed, will await results of this scan.   Flu vaccine need Flu vaccine given in office today.  Pt encouraged to manage side effects with supportive therapy.     Return TBD based on results.   Total time spent: 30 minutes  ALAN CHRISTELLA ARRANT, FNP  07/11/2024   This document may have been prepared by [facility] Voice Recognition software and as such may include unintentional dictation errors.

## 2024-07-17 ENCOUNTER — Ambulatory Visit: Payer: Self-pay

## 2024-07-21 ENCOUNTER — Encounter: Payer: Self-pay | Admitting: Family

## 2024-07-21 NOTE — Assessment & Plan Note (Signed)
 Continue current diabetes POC, as patient has been well controlled on current regimen.  Will adjust meds if needed based on labs.

## 2024-07-21 NOTE — Assessment & Plan Note (Signed)
 Setting patient up for referral to gastroenterology. Will defer to them for further treatment changes.  Reassess at follow up.

## 2024-07-23 ENCOUNTER — Telehealth: Payer: Self-pay

## 2024-07-23 ENCOUNTER — Inpatient Hospital Stay
Admission: RE | Admit: 2024-07-23 | Discharge: 2024-07-23 | Disposition: A | Source: Ambulatory Visit | Attending: Family | Admitting: Family

## 2024-07-23 DIAGNOSIS — R222 Localized swelling, mass and lump, trunk: Secondary | ICD-10-CM

## 2024-07-23 MED ORDER — GADOPICLENOL 0.5 MMOL/ML IV SOLN
10.0000 mL | Freq: Once | INTRAVENOUS | Status: AC | PRN
  Administered 2024-07-23: 10 mL via INTRAVENOUS

## 2024-07-23 NOTE — Telephone Encounter (Signed)
 Per pt she wants to cancel referral would like to go to [facility] where she can see Dr. Unk

## 2024-07-24 ENCOUNTER — Other Ambulatory Visit: Payer: Self-pay | Admitting: Family

## 2024-08-13 ENCOUNTER — Other Ambulatory Visit

## 2024-08-13 DIAGNOSIS — E1169 Type 2 diabetes mellitus with other specified complication: Secondary | ICD-10-CM

## 2024-08-13 DIAGNOSIS — K219 Gastro-esophageal reflux disease without esophagitis: Secondary | ICD-10-CM

## 2024-08-13 DIAGNOSIS — I152 Hypertension secondary to endocrine disorders: Secondary | ICD-10-CM

## 2024-08-13 DIAGNOSIS — E119 Type 2 diabetes mellitus without complications: Secondary | ICD-10-CM

## 2024-08-14 LAB — CBC WITH DIFFERENTIAL/PLATELET
Basophils Absolute: 0 x10E3/uL (ref 0.0–0.2)
Basos: 0 %
EOS (ABSOLUTE): 0.1 x10E3/uL (ref 0.0–0.4)
Eos: 1 %
Hematocrit: 45.5 % (ref 34.0–46.6)
Hemoglobin: 14.6 g/dL (ref 11.1–15.9)
Immature Grans (Abs): 0 x10E3/uL (ref 0.0–0.1)
Immature Granulocytes: 0 %
Lymphocytes Absolute: 2.7 x10E3/uL (ref 0.7–3.1)
Lymphs: 31 %
MCH: 28.7 pg (ref 26.6–33.0)
MCHC: 32.1 g/dL (ref 31.5–35.7)
MCV: 89 fL (ref 79–97)
Monocytes Absolute: 0.5 x10E3/uL (ref 0.1–0.9)
Monocytes: 6 %
Neutrophils Absolute: 5.4 x10E3/uL (ref 1.4–7.0)
Neutrophils: 62 %
Platelets: 215 x10E3/uL (ref 150–450)
RBC: 5.09 x10E6/uL (ref 3.77–5.28)
RDW: 13.3 % (ref 11.7–15.4)
WBC: 8.9 x10E3/uL (ref 3.4–10.8)

## 2024-08-14 LAB — LIPID PANEL W/O CHOL/HDL RATIO
Cholesterol, Total: 136 mg/dL (ref 100–199)
HDL: 51 mg/dL (ref >39)
LDL Chol Calc (NIH): 69 mg/dL (ref 0–99)
Triglycerides: 85 mg/dL (ref 0–149)
VLDL Cholesterol Cal: 16 mg/dL (ref 5–40)

## 2024-08-14 LAB — CMP14+EGFR
ALT: 19 IU/L (ref 0–32)
AST: 16 IU/L (ref 0–40)
Albumin: 4.1 g/dL (ref 3.8–4.9)
Alkaline Phosphatase: 94 IU/L (ref 49–135)
BUN/Creatinine Ratio: 16 (ref 9–23)
BUN: 14 mg/dL (ref 6–24)
Bilirubin Total: 0.4 mg/dL (ref 0.0–1.2)
CO2: 22 mmol/L (ref 20–29)
Calcium: 9.5 mg/dL (ref 8.7–10.2)
Chloride: 107 mmol/L — ABNORMAL HIGH (ref 96–106)
Creatinine, Ser: 0.9 mg/dL (ref 0.57–1.00)
Globulin, Total: 2.2 g/dL (ref 1.5–4.5)
Glucose: 108 mg/dL — ABNORMAL HIGH (ref 70–99)
Potassium: 4.1 mmol/L (ref 3.5–5.2)
Sodium: 141 mmol/L (ref 134–144)
Total Protein: 6.3 g/dL (ref 6.0–8.5)
eGFR: 76 mL/min/1.73 (ref >59)

## 2024-08-14 LAB — HEMOGLOBIN A1C
Est. average glucose Bld gHb Est-mCnc: 131 mg/dL
Hgb A1c MFr Bld: 6.2 % — ABNORMAL HIGH (ref 4.8–5.6)

## 2024-08-15 ENCOUNTER — Ambulatory Visit: Payer: Self-pay | Admitting: Internal Medicine

## 2024-08-15 NOTE — Progress Notes (Signed)
 Left message to return call

## 2024-08-18 ENCOUNTER — Ambulatory Visit: Admitting: Internal Medicine

## 2024-08-21 ENCOUNTER — Ambulatory Visit: Admitting: Internal Medicine

## 2024-08-28 ENCOUNTER — Encounter: Payer: Self-pay | Admitting: Internal Medicine

## 2024-08-28 ENCOUNTER — Ambulatory Visit: Admitting: Internal Medicine

## 2024-08-28 VITALS — BP 104/76 | HR 84 | Ht 64.0 in | Wt 212.0 lb

## 2024-08-28 DIAGNOSIS — D1722 Benign lipomatous neoplasm of skin and subcutaneous tissue of left arm: Secondary | ICD-10-CM | POA: Insufficient documentation

## 2024-08-28 DIAGNOSIS — G43009 Migraine without aura, not intractable, without status migrainosus: Secondary | ICD-10-CM

## 2024-08-28 DIAGNOSIS — Z6836 Body mass index (BMI) 36.0-36.9, adult: Secondary | ICD-10-CM | POA: Diagnosis not present

## 2024-08-28 DIAGNOSIS — F411 Generalized anxiety disorder: Secondary | ICD-10-CM

## 2024-08-28 DIAGNOSIS — E1165 Type 2 diabetes mellitus with hyperglycemia: Secondary | ICD-10-CM | POA: Diagnosis not present

## 2024-08-28 DIAGNOSIS — M7582 Other shoulder lesions, left shoulder: Secondary | ICD-10-CM | POA: Insufficient documentation

## 2024-08-28 DIAGNOSIS — E785 Hyperlipidemia, unspecified: Secondary | ICD-10-CM

## 2024-08-28 DIAGNOSIS — E119 Type 2 diabetes mellitus without complications: Secondary | ICD-10-CM

## 2024-08-28 DIAGNOSIS — G4733 Obstructive sleep apnea (adult) (pediatric): Secondary | ICD-10-CM

## 2024-08-28 DIAGNOSIS — E1159 Type 2 diabetes mellitus with other circulatory complications: Secondary | ICD-10-CM

## 2024-08-28 DIAGNOSIS — I152 Hypertension secondary to endocrine disorders: Secondary | ICD-10-CM | POA: Diagnosis not present

## 2024-08-28 DIAGNOSIS — E1169 Type 2 diabetes mellitus with other specified complication: Secondary | ICD-10-CM

## 2024-08-28 MED ORDER — PREDNISONE 20 MG PO TABS: 40.0000 mg | ORAL_TABLET | Freq: Every day | ORAL | 0 refills | Status: AC

## 2024-08-28 NOTE — Progress Notes (Signed)
 Established Patient Office Visit  Subjective:  Patient ID: Penny Myers, female    DOB: [birth year]  Age: 53 y.o. MRN: [id number]  Chief Complaint  Patient presents with   Follow-up    3 month follow up    Patient is here today for follow up. She reports feeling fairly well but has concerns today.  Patient has not seen her orthopedic specialist in quite some time. She would like a referral to local specialist as she used to go to [facility] in [place].. Will send referral to local orthopedic specialist. Will also start prednisone burst for 5 days in the mean time. Patient would benefit from joint injections in her left shoulder. She has had muscle relaxer's in the past for left sided muscle tension. Her neurologist has been managing to refill this medication. Voltaran gel has not helped relieve her symptoms. She uses roll on bio freeze with minimal relief. She also reports using alternating heat and ice. She had physical therapy in the last 6 months and reports that helped but did not improve her symptoms significantly. 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  Patient would like referral to general surgery to have lipoma removed. Patient states it causes her pain and discomfort.  Patient still has headaches and left sided neck tightness. She has had multiple unremarkable imaging. Neurology suspected the headaches we combination of sleep apnea and stress. She uses her Bipap machine nightly. She is not taking her migraine medication but has some Emgality at home. Recommend patient restart this medication. Reinforced stress reduction and massage therapy.  She reports taking her medications as prescribed. She is on Effexor and reports still having issues with anxiety and panic attacks; will increase Effexor to 75 mg daily. Patient wants psychology and and psychiatry and has been looking for someone locally that  takes her insurance. Will send referral to psychiatry.  Patient recently had routine blood work completed and we discussed that in detail today. Reinforced healthy diet and exercise as tolerated.    No other concerns at this time.   Past Medical History:  Diagnosis Date   Acid reflux    Anemia    Anxiety    Atypical chest pain 07/02/2015   Note: Unchanged Note: Unchanged   Family history of malignant neoplasm 09/28/2015   Note: Unchanged   Heart murmur    Hypertension    States that she does not have HBP, but takes losartan for leaking valve.   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HCC)    Migraine headache    Sleep apnea    APAP   Type 2 diabetes mellitus (HCC)    Wears contact lenses     Past Surgical History:  Procedure Laterality Date   APPENDECTOMY     CHOLECYSTECTOMY     ESOPHAGOGASTRODUODENOSCOPY N/A 01/08/2024   Procedure: EGD (ESOPHAGOGASTRODUODENOSCOPY);  Surgeon: Unk Corinn Skiff, MD;  Location: [facility];  Service: Endoscopy;  Laterality: N/A;   SHOULDER ARTHROSCOPY WITH SUBACROMIAL DECOMPRESSION Left 12/09/2015   Procedure: SHOULDER ARTHROSCOPY WITH SUBACROMIAL DECOMPRESSION;  Surgeon: Toribio JULIANNA Chancy, MD;  Location: [facility];  Service: Orthopedics;  Laterality: Left;   TUBAL LIGATION      Social History   Socioeconomic History   Marital status: Married    Spouse name: Not on file   Number of children: Not on file   Years of education: Not on file  Highest education level: Not on file  Occupational History   Not on file  Tobacco Use   Smoking status: Every Day    Current packs/day: 0.30    Average packs/day: 0.3 packs/day for 36.9 years (11.1 ttl pk-yrs)    Types: Cigarettes    Start date: 1989   Smokeless tobacco: Never  Vaping Use   Vaping status: Never Used  Substance and Sexual Activity   Alcohol use: Yes    Comment: occ   Drug use: Yes    Types: Marijuana   Sexual activity: Not on file  Other Topics Concern    Not on file  Social History Narrative   Not on file   Social Drivers of Health   Tobacco Use: High Risk (08/28/2024)   Patient History    Smoking Tobacco Use: Every Day    Smokeless Tobacco Use: Never    Passive Exposure: Not on file  Financial Resource Strain: Not on file  Food Insecurity: Not on file  Transportation Needs: Not on file  Physical Activity: Not on file  Stress: Not on file  Social Connections: Not on file  Intimate Partner Violence: Not on file  Depression (PHQ2-9): Low Risk (09/25/2023)   Depression (PHQ2-9)    PHQ-2 Score: 2  Alcohol Screen: Not on file  Housing: Not on file  Utilities: Not on file  Health Literacy: Not on file    Family History  Problem Relation Age of Onset   Cancer Maternal Aunt    Breast cancer Maternal Aunt 40 - 49       recurred   Gastric cancer Maternal Grandmother     Allergies[1]  Show/hide medication list[2]  Review of Systems  Constitutional: Negative.  Negative for chills, fever and malaise/fatigue.  HENT: Negative.  Negative for congestion and sore throat.   Eyes: Negative.  Negative for blurred vision and pain.  Respiratory: Negative.  Negative for cough and shortness of breath.   Cardiovascular: Negative.  Negative for chest pain, palpitations and leg swelling.  Gastrointestinal: Negative.  Negative for abdominal pain, blood in stool, constipation, diarrhea, heartburn, melena, nausea and vomiting.  Genitourinary: Negative.  Negative for dysuria, flank pain, frequency and urgency.  Musculoskeletal:  Positive for joint pain (left shoulder pain) and neck pain (left sided). Negative for myalgias.  Skin: Negative.   Neurological:  Positive for headaches. Negative for dizziness, tingling, sensory change and weakness.  Endo/Heme/Allergies: Negative.   Psychiatric/Behavioral:  Negative for depression and suicidal ideas. The patient is nervous/anxious.        Objective:   BP 104/76   Pulse 84   Ht 5' 4 (1.626 m)   Wt  212 lb (96.2 kg)   SpO2 97%   BMI 36.39 kg/m   Vitals:   08/28/24 1457  BP: 104/76  Pulse: 84  Height: 5' 4 (1.626 m)  Weight: 212 lb (96.2 kg)  SpO2: 97%  BMI (Calculated): 36.37    Physical Exam Vitals and nursing note reviewed.  Constitutional:      Appearance: Normal appearance.  HENT:     Head: Normocephalic and atraumatic.     Nose: Nose normal.     Mouth/Throat:     Mouth: Mucous membranes are moist.     Pharynx: Oropharynx is clear.  Eyes:     Conjunctiva/sclera: Conjunctivae normal.     Pupils: Pupils are equal, round, and reactive to light.  Cardiovascular:     Rate and Rhythm: Normal rate and regular rhythm.  Pulses: Normal pulses.     Heart sounds: Normal heart sounds. No murmur heard. Pulmonary:     Effort: Pulmonary effort is normal.     Breath sounds: Normal breath sounds. No wheezing.  Abdominal:     General: Bowel sounds are normal.     Palpations: Abdomen is soft.     Tenderness: There is no abdominal tenderness. There is no right CVA tenderness or left CVA tenderness.  Musculoskeletal:        General: Normal range of motion.     Cervical back: Normal range of motion.     Right lower leg: No edema.     Left lower leg: No edema.  Skin:    General: Skin is warm and dry.     Comments: Lipoma medial to left scapula  Neurological:     General: No focal deficit present.     Mental Status: She is alert and oriented to person, place, and time.  Psychiatric:        Mood and Affect: Mood normal.        Behavior: Behavior normal.      No results found for any visits on 08/28/24.  Recent Results (from the past 2160 hours)  POCT CBG (Fasting - Glucose)     Status: Abnormal   Collection Time: 07/11/24  1:56 PM  Result Value Ref Range   Glucose Fasting, POC 110 (A) 70 - 99 mg/dL  [id number]     Status: Abnormal   Collection Time: 08/13/24 10:22 AM  Result Value Ref Range   Glucose 108 (H) 70 - 99 mg/dL   BUN 14 6 - 24 mg/dL   Creatinine, Ser  9.09 0.57 - 1.00 mg/dL   eGFR 76 >40 fO/fpw/8.26   BUN/Creatinine Ratio 16 9 - 23   Sodium 141 134 - 144 mmol/L   Potassium 4.1 3.5 - 5.2 mmol/L   Chloride 107 (H) 96 - 106 mmol/L   CO2 22 20 - 29 mmol/L   Calcium 9.5 8.7 - 10.2 mg/dL   Total Protein 6.3 6.0 - 8.5 g/dL   Albumin 4.1 3.8 - 4.9 g/dL   Globulin, Total 2.2 1.5 - 4.5 g/dL   Bilirubin Total 0.4 0.0 - 1.2 mg/dL   Alkaline Phosphatase 94 49 - 135 IU/L   AST 16 0 - 40 IU/L   ALT 19 0 - 32 IU/L  Lipid Panel w/o Chol/HDL Ratio     Status: None   Collection Time: 08/13/24 10:22 AM  Result Value Ref Range   Cholesterol, Total 136 100 - 199 mg/dL   Triglycerides 85 0 - 149 mg/dL   HDL 51 >60 mg/dL   VLDL Cholesterol Cal 16 5 - 40 mg/dL   LDL Chol Calc (NIH) 69 0 - 99 mg/dL  CBC with Diff     Status: None   Collection Time: 08/13/24 10:22 AM  Result Value Ref Range   WBC 8.9 3.4 - 10.8 x10E3/uL   RBC 5.09 3.77 - 5.28 x10E6/uL   Hemoglobin 14.6 11.1 - 15.9 g/dL   Hematocrit 54.4 65.9 - 46.6 %   MCV 89 79 - 97 fL   MCH 28.7 26.6 - 33.0 pg   MCHC 32.1 31.5 - 35.7 g/dL   RDW 86.6 88.2 - 84.5 %   Platelets 215 150 - 450 x10E3/uL   Neutrophils 62 Not Estab. %   Lymphs 31 Not Estab. %   Monocytes 6 Not Estab. %   Eos 1 Not Estab. %  Basos 0 Not Estab. %   Neutrophils Absolute 5.4 1.4 - 7.0 x10E3/uL   Lymphocytes Absolute 2.7 0.7 - 3.1 x10E3/uL   Monocytes Absolute 0.5 0.1 - 0.9 x10E3/uL   EOS (ABSOLUTE) 0.1 0.0 - 0.4 x10E3/uL   Basophils Absolute 0.0 0.0 - 0.2 x10E3/uL   Immature Granulocytes 0 Not Estab. %   Immature Grans (Abs) 0.0 0.0 - 0.1 x10E3/uL  Hemoglobin A1c     Status: Abnormal   Collection Time: 08/13/24 10:22 AM  Result Value Ref Range   Hgb A1c MFr Bld 6.2 (H) 4.8 - 5.6 %    Comment:          Prediabetes: 5.7 - 6.4          Diabetes: >6.4          Glycemic control for adults with diabetes: <7.0    Est. average glucose Bld gHb Est-mCnc 131 mg/dL      Assessment & Plan:  Increase effexor to 75 mg  daily. Start Prednisone burst. Continue taking other medications as prescribed. Orthopedic, general surgery, and psychiatry referrals sent. Reinforced healthy diet and exercise as tolerated. Problem List Items Addressed This Visit       Cardiovascular and Mediastinum   Migraine without aura and without status migrainosus, not intractable   Relevant Medications   venlafaxine XR (EFFEXOR XR) 75 MG 24 hr capsule   Hypertension associated with diabetes (HCC) - Primary     Respiratory   OSA (obstructive sleep apnea)     Endocrine   Dyslipidemia associated with type 2 diabetes mellitus (HCC)   Type 2 diabetes mellitus without complication, without long-term current use of insulin (HCC)     Musculoskeletal and Integument   Left shoulder tendonitis   Relevant Medications   predniSONE (DELTASONE) 20 MG tablet   Other Relevant Orders   Ambulatory referral to Orthopedic Surgery     Other   GAD (generalized anxiety disorder)   Relevant Medications   venlafaxine XR (EFFEXOR XR) 75 MG 24 hr capsule   Other Relevant Orders   Ambulatory referral to Psychiatry   Lipoma of left shoulder   Relevant Orders   Ambulatory referral to General Surgery    Return in about 6 weeks (around 10/09/2024).   Total time spent: 25 minutes. This time includes review of previous notes and results and patient face to face interaction during today's visit.    FERNAND FREDY RAMAN, MD  08/28/2024   This document may have been prepared by [facility] Voice Recognition software and as such may include unintentional dictation errors.     [1] No Known Allergies [2]  Outpatient Medications Prior to Visit  Medication Sig   cyclobenzaprine  (FLEXERIL ) 10 MG tablet Take 10 mg by mouth 3 (three) times daily as needed.   hydrochlorothiazide (MICROZIDE) 12.5 MG capsule TAKE ONE CAPSULE BY MOUTH ONE TIME DAILY IF BLOOD PRESSURE IS ELEVATED   ipratropium (ATROVENT ) 0.06 % nasal spray Place 1 spray into the nose 3 (three) times  daily.   JARDIANCE  25 MG TABS tablet TAKE ONE TABLET BY MOUTH ONE TIME DAILY BEFORE BREAKFAST   linaclotide  (LINZESS ) 72 MCG capsule Take 1 capsule (72 mcg total) by mouth daily before breakfast.   loratadine (CLARITIN) 10 MG tablet Take by mouth.   losartan (COZAAR) 100 MG tablet TAKE ONE TABLET BY MOUTH ONE TIME DAILY   pantoprazole  (PROTONIX ) 40 MG tablet Take 1 tablet (40 mg total) by mouth in the morning and at bedtime.   [DISCONTINUED] venlafaxine (EFFEXOR)  37.5 MG tablet Take 37.5 mg by mouth.   dicyclomine  (BENTYL ) 10 MG capsule Take 1 capsule (10 mg total) by mouth 4 (four) times daily -  before meals and at bedtime.   EMGALITY 120 MG/ML SOAJ Inject into the skin. (Patient not taking: Reported on 08/28/2024)   [DISCONTINUED] amoxicillin -clavulanate (AUGMENTIN ) 875-125 MG tablet Take 1 tablet by mouth 2 (two) times daily. (Patient not taking: Reported on 08/28/2024)   [DISCONTINUED] diclofenac Sodium (VOLTAREN) 1 % GEL Voltaren 1% External Gel QTY: 50 gram Days: 30 Refills: 2  Written: 06/30/21 Patient Instructions: apply bid to the area for 2 weks (Patient not taking: Reported on 08/28/2024)   [DISCONTINUED] fluticasone  (FLONASE ) 50 MCG/ACT nasal spray Place 1 spray into both nostrils daily. (Patient not taking: Reported on 08/28/2024)   [DISCONTINUED] methylPREDNISolone  (MEDROL  DOSEPAK) 4 MG TBPK tablet Please follow the directions on the box. (Patient not taking: Reported on 08/28/2024)   No facility-administered medications prior to visit.

## 2024-09-22 ENCOUNTER — Encounter: Payer: Self-pay | Admitting: Surgery

## 2024-09-22 ENCOUNTER — Ambulatory Visit (INDEPENDENT_AMBULATORY_CARE_PROVIDER_SITE_OTHER): Admitting: Surgery

## 2024-09-22 VITALS — BP 131/85 | HR 90 | Temp 98.7°F | Ht 64.0 in | Wt 208.0 lb

## 2024-09-22 DIAGNOSIS — D171 Benign lipomatous neoplasm of skin and subcutaneous tissue of trunk: Secondary | ICD-10-CM

## 2024-09-22 NOTE — Patient Instructions (Signed)
 We have spoken today about removing a Lipoma. This will be done by Dr. Mauri Sous at [facility].  If you are on any injectable weight loss medication, you will need to stop taking your GLP-1 injectable (weight loss) medications 8 days before your surgery to avoid any complications with anesthesia.   You will most likely be able to leave the hospital several hours after your surgery. You may need to have a drain placed after surgery, this depends on the size of your lipoma.  Plan to tentatively be off work for up to 1 week following the surgery.  Please see your Blue surgery sheet for more information. Our surgery scheduler will call you to look at surgery dates and to go over information.   If you have FMLA or Disability paperwork that needs to be filled out, please have your company fax your paperwork to [phone] or you may drop this by either office. This paperwork will be filled out within 3 days after your surgery has been completed.  Lipoma Removal  Lipoma removal is a surgical procedure to remove a lipoma, which is a noncancerous (benign) tumor that is made up of fat cells. Most lipomas are small and painless and do not require treatment. They can form in many areas of the body but are most common under the skin of the back, arms, shoulders, buttocks, and thighs. You may need lipoma removal if you have a lipoma that is large, growing, or causing discomfort. Lipoma removal may also be done for cosmetic reasons. Tell a health care provider about: Any allergies you have. All medicines you are taking, including vitamins, herbs, eye drops, creams, and over-the-counter medicines. Any problems you or family members have had with anesthetic medicines. Any bleeding problems you have. Any surgeries you have had. Any medical conditions you have. Whether you are pregnant or may be pregnant. What are the risks? Generally, this is a safe procedure. However, problems may occur,  including: Infection. Bleeding. Scarring. Allergic reactions to medicines. Damage to nearby structures or organs, such as damage to nerves or blood vessels near the lipoma. What happens before the procedure? Medicines Ask your health care provider about: Changing or stopping your regular medicines. This is especially important if you are taking diabetes medicines or blood thinners. Taking medicines such as aspirin and ibuprofen. These medicines can thin your blood. Do not take these medicines unless your health care provider tells you to take them. Taking over-the-counter medicines, vitamins, herbs, and supplements. General instructions You will have a physical exam. Your health care provider will check the size of the lipoma and whether it can be removed easily. You may have a biopsy and imaging tests, such as X-rays, a CT scan, and an MRI. Do not use any products that contain nicotine or tobacco for at least 4 weeks before the procedure. These products include cigarettes, chewing tobacco, and vaping devices, such as e-cigarettes. If you need help quitting, ask your health care provider. Ask your health care provider: How your surgery site will be marked. What steps will be taken to help prevent infection. These may include: Washing skin with a germ-killing soap. Taking antibiotic medicine. If you will be going home right after the procedure, plan to have a responsible adult: Take you home from the hospital or clinic. You will not be allowed to drive. Care for you for the time you are told. What happens during the procedure?  An IV will be inserted into one of your veins. You  will be given one or more of the following: A medicine to help you relax (sedative). A medicine to numb the area (local anesthetic). A medicine to make you fall asleep (general anesthetic). A medicine that is injected into an area of your body to numb everything below the injection site (regional anesthetic). An  incision will be made into the skin over the lipoma or very near the lipoma. The incision may be made in a natural skin line or crease. Tissues, nerves, and blood vessels near the lipoma will be moved out of the way. The lipoma and the capsule that surrounds it will be separated from the surrounding tissues. The lipoma will be removed. You may have a drain placed depending on the size of your lipoma The incision may be closed with stitches (sutures). A bandage (dressing) will be placed over the incision. The procedure may vary among health care providers and hospitals. What happens after the procedure? Your blood pressure, heart rate, breathing rate, and blood oxygen level will be monitored until you leave the hospital or clinic. If you were prescribed an antibiotic medicine, use it as told by your health care provider. Do not stop using the antibiotic even if you start to feel better. If you were given a sedative during the procedure, it can affect you for several hours. Do not drive or operate machinery until your health care provider says that it is safe. Where to find more information OrthoInfo: orthoinfo.aaos.org Summary Before the procedure, follow instructions from your health care provider about eating and drinking, and changing or stopping your regular medicines. This is especially important if you are taking diabetes medicines or blood thinners. After the lipoma is removed, the incision may be closed with stitches (sutures) and covered with a bandage (dressing). If you were given a sedative during the procedure, it can affect you for several hours. Do not drive or operate machinery until your health care provider says that it is safe.

## 2024-09-22 NOTE — Progress Notes (Signed)
 " 09/22/2024  Reason for Visit: Left back lipomas  Requesting Provider:  Fredy Bathe, MD  History of Present Illness: Penny Myers is a 54 y.o. female presenting for evaluation of 2 left back lipomas.  The patient reports having these masses for at least a year and reports that she feels over the course of time the may have gotten slightly bigger but she definitely has noticed increased symptoms in the left back area.  She notices discomfort depending how she is moving her left shoulder and scapula and she notices also discomfort when she lies on her left side to sleep.  She also has been noticing headaches and tension issues in the left neck but is unclear whether this is related to the masses or not.  She had an MRI of the chest on 07/23/2024 which was able to fully visualize the mass that is in the left mid back area which measures 7.2 x 7.3 x 2.5 cm.  The inferior aspect of the other lipoma in the left upper back is also noticeable but this was not evaluated on the report.  She reports having left shoulder surgery but otherwise denies any trauma to the area.  Past Medical History: Past Medical History:  Diagnosis Date   Acid reflux    Anemia    Anxiety    Atypical chest pain 07/02/2015   Note: Unchanged Note: Unchanged   Family history of malignant neoplasm 09/28/2015   Note: Unchanged   Heart murmur    Hypertension    States that she does not have HBP, but takes losartan for leaking valve.   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HCC)    Migraine headache    Sleep apnea    APAP   Type 2 diabetes mellitus (HCC)    Wears contact lenses      Past Surgical History: Past Surgical History:  Procedure Laterality Date   APPENDECTOMY     CHOLECYSTECTOMY     ESOPHAGOGASTRODUODENOSCOPY N/A 01/08/2024   Procedure: EGD (ESOPHAGOGASTRODUODENOSCOPY);  Surgeon: Unk Corinn Skiff, MD;  Location: [facility];  Service: Endoscopy;  Laterality: N/A;   SHOULDER ARTHROSCOPY WITH SUBACROMIAL  DECOMPRESSION Left 12/09/2015   Procedure: SHOULDER ARTHROSCOPY WITH SUBACROMIAL DECOMPRESSION;  Surgeon: Toribio JULIANNA Chancy, MD;  Location: [facility];  Service: Orthopedics;  Laterality: Left;   TUBAL LIGATION      Home Medications: Prior to Admission medications  Medication Sig Start Date End Date Taking? Authorizing Provider  cyclobenzaprine  (FLEXERIL ) 10 MG tablet Take 10 mg by mouth 3 (three) times daily as needed. 05/25/23  Yes [provider]  dicyclomine  (BENTYL ) 10 MG capsule Take 1 capsule (10 mg total) by mouth 4 (four) times daily -  before meals and at bedtime. 05/01/24  Yes Bathe Fredy RAMAN, MD  hydrochlorothiazide (MICROZIDE) 12.5 MG capsule TAKE ONE CAPSULE BY MOUTH ONE TIME DAILY IF BLOOD PRESSURE IS ELEVATED 07/25/24  Yes Orlean Alan HERO, FNP  ipratropium (ATROVENT ) 0.06 % nasal spray Place 1 spray into the nose 3 (three) times daily. 07/11/24 07/11/25 Yes Orlean Alan HERO, FNP  JARDIANCE  25 MG TABS tablet TAKE ONE TABLET BY MOUTH ONE TIME DAILY BEFORE BREAKFAST 06/30/24  Yes Bathe Fredy RAMAN, MD  linaclotide  (LINZESS ) 72 MCG capsule Take 1 capsule (72 mcg total) by mouth daily before breakfast. 05/01/24  Yes Bathe Fredy RAMAN, MD  loratadine (CLARITIN) 10 MG tablet Take by mouth.   Yes [provider]  losartan (COZAAR) 100 MG tablet TAKE ONE TABLET BY MOUTH ONE  TIME DAILY 03/31/24  Yes Orlean Alan HERO, FNP  pantoprazole  (PROTONIX ) 40 MG tablet Take 1 tablet (40 mg total) by mouth in the morning and at bedtime. 07/11/24 07/11/25 Yes Orlean Alan HERO, FNP  predniSONE  (DELTASONE ) 20 MG tablet Take 2 tablets (40 mg total) by mouth daily with breakfast. 08/28/24  Yes Fernand Fredy RAMAN, MD  venlafaxine XR (EFFEXOR XR) 75 MG 24 hr capsule Take 1 capsule (75 mg total) by mouth daily. 08/28/24 08/28/25 Yes Fernand Fredy RAMAN, MD  EMGALITY 120 MG/ML SOAJ Inject into the skin. Patient not taking: Reported on 09/22/2024 03/23/23   [provider]     Allergies: Allergies[1]  Social History:  reports that she has been smoking cigarettes. She started smoking about 37 years ago. She has a 11.1 pack-year smoking history. She has never used smokeless tobacco. She reports current alcohol use. She reports current drug use. Drug: Marijuana.   Family History: Family History  Problem Relation Age of Onset   Cancer Maternal Aunt    Breast cancer Maternal Aunt 49 - 49       recurred   Gastric cancer Maternal Grandmother     Review of Systems: Review of Systems  Constitutional:  Negative for chills and fever.  HENT:  Negative for hearing loss.   Respiratory:  Negative for shortness of breath.   Cardiovascular:  Negative for chest pain.  Gastrointestinal:  Negative for abdominal pain, nausea and vomiting.  Genitourinary:  Negative for dysuria.  Musculoskeletal:  Positive for back pain and neck pain.  Skin:  Negative for rash.  Neurological:  Positive for headaches.  Psychiatric/Behavioral:  Negative for depression.     Physical Exam BP 131/85   Pulse 90   Temp 98.7 F (37.1 C) (Oral)   Ht 5' 4 (1.626 m)   Wt 208 lb (94.3 kg)   SpO2 95%   BMI 35.70 kg/m  CONSTITUTIONAL: No acute distress HEENT:  Normocephalic, atraumatic, extraocular motion intact. NECK: Trachea is midline, and there is no jugular venous distension.  RESPIRATORY:  Lungs are clear, and breath sounds are equal bilaterally. Normal respiratory effort without pathologic use of accessory muscles. CARDIOVASCULAR: Heart is regular without murmurs, gallops, or rubs. MUSCULOSKELETAL:  Normal muscle strength and tone in all four extremities.  No peripheral edema or cyanosis. SKIN: The patient has a 7-8 cm mass in the left mid back around the level of the bra strap.  This is soft, somewhat mobile, with some discomfort with deep palpation in the area.  There is no overlying skin erythema or induration.  She also has a smaller 5 cm mass in the left upper back near the  scapula which is also soft, somewhat mobile without any overlying skin changes. NEUROLOGIC:  Motor and sensation is grossly normal.  Cranial nerves are grossly intact. PSYCH:  Alert and oriented to person, place and time. Affect is normal.  Laboratory Analysis: Labs from 08/13/2024: Sodium 141, potassium 4.1, chloride 107, CO2 22, BUN 14, creatinine 0.9.  LFTs within normal limits.  WBC 8.9, hemoglobin 14.6, hematocrit 45.5, platelets 215.  Hemoglobin A1c 6.2.  Imaging: MRI chest on 07/23/2024: IMPRESSION: Prominent fat lobule versus unencapsulated lipoma in the subcutaneous fatty tissue of the left chest wall. No worrisome mass is identified.  Assessment and Plan: This is a 54 y.o. female with 2 left back lipomas.  - Discussed with the patient the presence of 2 back lipomas in the mid back level and upper back level.  Discussed with  the  patient that these masses are soft, somewhat mobile, consistent with lipomas.  Discussed with patient that lipomas are benign masses but unfortunately this can grow with time and as they grow could cause symptoms as there is push on structures.  However they do not invade through or penetrate through structures.  I believe some of the symptoms that she is having could be related to these masses and she could benefit from excision of these lipomas.  The patient is in agreement. - Discussed with the patient the plan for operative excision of the 2 back lipomas in the operating room under general anesthesia.  Reviewed with her the surgery at length including the planned incisions, risks of bleeding, infection, injury to surrounding structures, the potential for percutaneous drain, that this will be an outpatient procedure, postoperative activity restrictions, pain control, and she is willing to proceed. - Patient will be scheduled for surgery on 10/02/2024.  All of her questions have been answered.  I spent 40 minutes dedicated to the care of this patient on the date  of this encounter to include pre-visit review of records, face-to-face time with the patient discussing diagnosis and management, and any post-visit coordination of care.   Aloysius Sheree Plant, MD [facility]       [1] No Known Allergies  "

## 2024-09-23 ENCOUNTER — Telehealth: Payer: Self-pay | Admitting: Surgery

## 2024-09-23 NOTE — Telephone Encounter (Signed)
 Patient has been advised of Pre-Admission date/time, and Surgery date at [facility].  Surgery Date: 10/02/24 Preadmission Testing Date: 09/26/24 (phone 1p-4p)  Patient informed of the scheduling process and surgery information given at time of office visit.   Patient has been made aware to call [phone], between 1-3:00pm the day before surgery, to find out what time to arrive for surgery.

## 2024-09-24 ENCOUNTER — Telehealth: Payer: Self-pay | Admitting: Internal Medicine

## 2024-09-24 NOTE — Telephone Encounter (Signed)
 Patient left VM requesting we send her MRI results to Dr. Toparro at [facility]. Fax # [phone]

## 2024-09-25 ENCOUNTER — Telehealth: Payer: Self-pay

## 2024-09-25 ENCOUNTER — Other Ambulatory Visit: Payer: Self-pay | Admitting: Internal Medicine

## 2024-09-25 ENCOUNTER — Other Ambulatory Visit: Payer: Self-pay | Admitting: Family

## 2024-09-25 DIAGNOSIS — E119 Type 2 diabetes mellitus without complications: Secondary | ICD-10-CM

## 2024-09-25 NOTE — Telephone Encounter (Signed)
 Dr.Toparro is a development worker, international aid. He is requesting both the chest and shoulder MRI to be faxed over to [phone]. Her appointment is next Thursday he is wanting to review them prior.

## 2024-09-25 NOTE — Telephone Encounter (Signed)
 Fax sent

## 2024-09-25 NOTE — Telephone Encounter (Signed)
 Per Almarie this patient does not need referral they just need the results from the MRI she is faxing this

## 2024-09-25 NOTE — Telephone Encounter (Signed)
 Pt LM at 1:34p asking for  referral to be sent to Dr Daivd Nettles at [facility] phone number is [phone] Fax number is [phone]

## 2024-09-26 ENCOUNTER — Inpatient Hospital Stay
Admission: RE | Admit: 2024-09-26 | Discharge: 2024-09-26 | Disposition: A | Discharge status: Home / Self Care | Source: Ambulatory Visit

## 2024-09-26 NOTE — Patient Instructions (Signed)
 Your procedure is scheduled on: Report to the Registration Desk on the 1st floor of the [facility]. To find out your arrival time, please call [phone] between 1PM - 3PM on: If your arrival time is 6:00 am, do not arrive before that time as the [facility] entrance doors do not open until 6:00 am.  REMEMBER: Instructions that are not followed completely may result in serious medical risk, up to and including death; or upon the discretion of your surgeon and anesthesiologist your surgery may need to be rescheduled.  Do not eat food after midnight the night before surgery.  No gum chewing or hard candies.  You may however, drink CLEAR liquids up to 2 hours before you are scheduled to arrive for your surgery. Do not drink anything within 2 hours of your scheduled arrival time.  Clear liquids include: - water   - apple juice without pulp - gatorade (not RED colors) - black coffee or tea (Do NOT add milk or creamers to the coffee or tea) Do NOT drink anything that is not on this list.  **Type 1 and Type 2 diabetics should only drink water .**  In addition, your doctor has ordered for you to drink the provided:  Ensure Pre-Surgery Clear Carbohydrate Drink  Gatorade G2 Drinking this carbohydrate drink up to two hours before surgery helps to reduce insulin resistance and improve patient outcomes. Please complete drinking 2 hours before scheduled arrival time.  One week prior to surgery: Stop Anti-inflammatories (NSAIDS) such as Advil, Aleve, Ibuprofen, Motrin, Naproxen, Naprosyn and Aspirin based products such as Excedrin, Goody's Powder, BC Powder. Stop ANY OVER THE COUNTER supplements until after surgery.  You may however, continue to take Tylenol  if needed for pain up until the day of surgery.  **Follow guidelines for insulin and diabetes medications.**  **Follow recommendations regarding stopping blood thinners.**  Continue taking all of your other prescription medications up  until the day of surgery.  ON THE DAY OF SURGERY ONLY TAKE THESE MEDICATIONS WITH SIPS OF WATER :    Use inhalers on the day of surgery and bring to the hospital.  Fleets enema or bowel prep as directed.  No Alcohol for 24 hours before or after surgery.  No Smoking including e-cigarettes for 24 hours before surgery.  No chewable tobacco products for at least 6 hours before surgery.  No nicotine patches on the day of surgery.  Do not use any recreational drugs for at least a week (preferably 2 weeks) before your surgery.  Please be advised that the combination of cocaine and anesthesia may have negative outcomes, up to and including death. If you test positive for cocaine, your surgery will be cancelled.  On the morning of surgery brush your teeth with toothpaste and water , you may rinse your mouth with mouthwash if you wish. Do not swallow any toothpaste or mouthwash.  Use CHG Soap or wipes as directed on instruction sheet.  Do not wear jewelry, make-up, hairpins, clips or nail polish.  For welded (permanent) jewelry: bracelets, anklets, waist bands, etc.  Please have this removed prior to surgery.  If it is not removed, there is a chance that hospital personnel will need to cut it off on the day of surgery.  Do not wear lotions, powders, or perfumes.   Do not shave body hair from the neck down 48 hours before surgery.  Contact lenses, hearing aids and dentures may not be worn into surgery.  Do not bring valuables to the hospital. [facility]  is not responsible for any missing/lost belongings or valuables.   Total Shoulder Arthroplasty:  use Benzoyl Peroxide 5% Gel as directed on instruction sheet.  Bring your C-PAP to the hospital in case you may have to spend the night.   Notify your doctor if there is any change in your medical condition (cold, fever, infection).  Wear comfortable clothing (specific to your surgery type) to the hospital.  After surgery, you can help  prevent lung complications by doing breathing exercises.  Take deep breaths and cough every 1-2 hours. Your doctor may order a device called an Incentive Spirometer to help you take deep breaths. When coughing or sneezing, hold a pillow firmly against your incision with both hands. This is called "splinting." Doing this helps protect your incision. It also decreases belly discomfort.  If you are being admitted to the hospital overnight, leave your suitcase in the car. After surgery it may be brought to your room.  In case of increased patient census, it may be necessary for you, the patient, to continue your postoperative care in the Same Day Surgery department.  If you are being discharged the day of surgery, you will not be allowed to drive home. You will need a responsible individual to drive you home and stay with you for 24 hours after surgery.   If you are taking public transportation, you will need to have a responsible individual with you.  Please call the Pre-admissions Testing Dept. at [phone] if you have any questions about these instructions.  Surgery Visitation Policy:  Patients having surgery or a procedure may have two visitors.  Children under the age of 18 must have an adult with them who is not the patient.  Inpatient Visitation:    Visiting hours are 7 a.m. to 8 p.m. Up to four visitors are allowed at one time in a patient room. The visitors may rotate out with other people during the day.  One visitor age 64 or older may stay with the patient overnight and must be in the room by 8 p.m.   Merchandiser, retail to address health-related social needs:  [email or web address]

## 2024-09-27 NOTE — Progress Notes (Signed)
 [facility] [address] [place], [place] [address]  Pulmonary Sleep Medicine   Office Visit Note  Patient Name: Penny Myers DOB: 1971/05/09 MRN [id number]    Chief Complaint: Obstructive Sleep Apnea visit  Brief History:  Penny Myers is seen today for a follow up on APAP @ 5-20 cmH2O. The patient has a 1 year 2 month history of sleep apnea. Patient is using PAP nightly.  The patient feels neutral after sleeping with PAP.  The patient reports benefit from PAP use. Reported sleepiness is improved and the Epworth Sleepiness Score is 8 out of 24. The patient does not take naps. The patient complains of the following: needing new equipment today.  The compliance download shows 93% compliance with an average use time of 5 hours 58 minutes. The AHI is 0.8  The patient does complain of limb movements disrupting sleep.  ROS  General: (-) fever, (-) chills, (-) night sweat Nose and Sinuses: (-) nasal stuffiness or itchiness, (-) postnasal drip, (-) nosebleeds, (-) sinus trouble. Mouth and Throat: (-) sore throat, (-) hoarseness. Neck: (-) swollen glands, (-) enlarged thyroid , (-) neck pain. Respiratory: - cough, - shortness of breath, - wheezing. Neurologic: - numbness, - tingling. Psychiatric: +anxiety, - depression   Current Medication: Outpatient Encounter Medications as of 09/29/2024  Medication Sig   cyclobenzaprine  (FLEXERIL ) 10 MG tablet Take 10 mg by mouth 3 (three) times daily as needed.   dicyclomine  (BENTYL ) 10 MG capsule Take 1 capsule (10 mg total) by mouth 4 (four) times daily -  before meals and at bedtime.   EMGALITY 120 MG/ML SOAJ Inject into the skin. (Patient not taking: Reported on 09/22/2024)   empagliflozin  (JARDIANCE ) 25 MG TABS tablet TAKE ONE TABLET BY MOUTH ONE TIME DAILY BEFORE BREAKFAST   hydrochlorothiazide (MICROZIDE) 12.5 MG capsule TAKE ONE CAPSULE BY MOUTH ONE TIME DAILY IF BLOOD PRESSURE IS ELEVATED   ipratropium (ATROVENT ) 0.06 % nasal spray  Place 1 spray into the nose 3 (three) times daily.   linaclotide  (LINZESS ) 72 MCG capsule Take 1 capsule (72 mcg total) by mouth daily before breakfast.   loratadine (CLARITIN) 10 MG tablet Take by mouth.   losartan (COZAAR) 100 MG tablet TAKE ONE TABLET BY MOUTH ONE TIME DAILY   pantoprazole  (PROTONIX ) 40 MG tablet Take 1 tablet (40 mg total) by mouth in the morning and at bedtime.   predniSONE  (DELTASONE ) 20 MG tablet Take 2 tablets (40 mg total) by mouth daily with breakfast.   venlafaxine XR (EFFEXOR XR) 75 MG 24 hr capsule Take 1 capsule (75 mg total) by mouth daily.   No facility-administered encounter medications on file as of 09/29/2024.    Surgical History: Past Surgical History:  Procedure Laterality Date   APPENDECTOMY     CHOLECYSTECTOMY     ESOPHAGOGASTRODUODENOSCOPY N/A 01/08/2024   Procedure: EGD (ESOPHAGOGASTRODUODENOSCOPY);  Surgeon: Unk Corinn Skiff, MD;  Location: [facility];  Service: Endoscopy;  Laterality: N/A;   SHOULDER ARTHROSCOPY WITH SUBACROMIAL DECOMPRESSION Left 12/09/2015   Procedure: SHOULDER ARTHROSCOPY WITH SUBACROMIAL DECOMPRESSION;  Surgeon: Toribio JULIANNA Chancy, MD;  Location: [facility];  Service: Orthopedics;  Laterality: Left;   TUBAL LIGATION      Medical History: Past Medical History:  Diagnosis Date   Acid reflux    Anemia    Anxiety    Atypical chest pain 07/02/2015   Note: Unchanged Note: Unchanged   Family history of malignant neoplasm 09/28/2015   Note: Unchanged   Heart murmur  Hypertension    States that Penny Myers does not have HBP, but takes losartan for leaking valve.   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HCC)    Migraine headache    Sleep apnea    APAP   Type 2 diabetes mellitus (HCC)    Wears contact lenses     Family History: Non contributory to the present illness  Social History: Social History   Socioeconomic History   Marital status: Married    Spouse name: Not on file   Number of children: Not on  file   Years of education: Not on file   Highest education level: Not on file  Occupational History   Not on file  Tobacco Use   Smoking status: Every Day    Current packs/day: 0.30    Average packs/day: 0.3 packs/day for 37.0 years (11.1 ttl pk-yrs)    Types: Cigarettes    Start date: 1989   Smokeless tobacco: Never  Vaping Use   Vaping status: Never Used  Substance and Sexual Activity   Alcohol use: Yes    Comment: occ   Drug use: Yes    Types: Marijuana   Sexual activity: Not on file  Other Topics Concern   Not on file  Social History Narrative   Not on file   Social Drivers of Health   Tobacco Use: High Risk (09/22/2024)   Patient History    Smoking Tobacco Use: Every Day    Smokeless Tobacco Use: Never    Passive Exposure: Not on file  Financial Resource Strain: Not on file  Food Insecurity: Not on file  Transportation Needs: Not on file  Physical Activity: Not on file  Stress: Not on file  Social Connections: Not on file  Intimate Partner Violence: Not on file  Depression (PHQ2-9): Low Risk (09/25/2023)   Depression (PHQ2-9)    PHQ-2 Score: 2  Alcohol Screen: Not on file  Housing: Not on file  Utilities: Not on file  Health Literacy: Not on file    Vital Signs: There were no vitals taken for this visit. There is no height or weight on file to calculate BMI.    Examination: General Appearance: The patient is well-developed, well-nourished, and in no distress. Neck Circumference: 38 cm Skin: Gross inspection of skin unremarkable. Head: normocephalic, no gross deformities. Eyes: no gross deformities noted. ENT: ears appear grossly normal Neurologic: Alert and oriented. No involuntary movements.  STOP BANG RISK ASSESSMENT S (snore) Have you been told that you snore?     NO   T (tired) Are you often tired, fatigued, or sleepy during the day?   NO  O (obstruction) Do you stop breathing, choke, or gasp during sleep? NO   P (pressure) Do you have or are  you being treated for high blood pressure? YES   B (BMI) Is your body index greater than 35 kg/m? YES   A (age) Are you 56 years old or older? YES   N (neck) Do you have a neck circumference greater than 16 inches?   NO   G (gender) Are you a female? NO   TOTAL STOP/BANG YES ANSWERS 3       A STOP-Bang score of 2 or less is considered low risk, and a score of 5 or more is high risk for having either moderate or severe OSA. For people who score 3 or 4, doctors may need to perform further assessment to determine how likely they are to have OSA.  EPWORTH SLEEPINESS SCALE:  Scale:  (0)= no chance of dozing; (1)= slight chance of dozing; (2)= moderate chance of dozing; (3)= high chance of dozing  Chance  Situtation    Sitting and reading: 2    Watching TV: 1    Sitting Inactive in public: 2    As a passenger in car: 2      Lying down to rest: 0    Sitting and talking: 0    Sitting quielty after lunch: 1    In a car, stopped in traffic: 0   TOTAL SCORE:   8 out of 24    SLEEP STUDIES:  PSG (07/2023) AHI 8/hr, REM AHI 26/hr, min SP02 86%   CPAP COMPLIANCE DATA:  Date Range: 03/25/2024 - 09/25/2024  Average Daily Use: 6 hours 12 minutes  Median Use: 6 hours 9 minutes  Compliance for > 4 Hours: 93% days  AHI: 0.8 respiratory events per hour  Days Used: 178/185  Mask Leak: 9.5  95th Percentile Pressure: 11.1 cmH2O         LABS: Recent Results (from the past 2160 hours)  POCT CBG (Fasting - Glucose)     Status: Abnormal   Collection Time: 07/11/24  1:56 PM  Result Value Ref Range   Glucose Fasting, POC 110 (A) 70 - 99 mg/dL  [id number]     Status: Abnormal   Collection Time: 08/13/24 10:22 AM  Result Value Ref Range   Glucose 108 (H) 70 - 99 mg/dL   BUN 14 6 - 24 mg/dL   Creatinine, Ser 9.09 0.57 - 1.00 mg/dL   eGFR 76 >40 fO/fpw/8.26   BUN/Creatinine Ratio 16 9 - 23   Sodium 141 134 - 144 mmol/L   Potassium 4.1 3.5 - 5.2 mmol/L    Chloride 107 (H) 96 - 106 mmol/L   CO2 22 20 - 29 mmol/L   Calcium 9.5 8.7 - 10.2 mg/dL   Total Protein 6.3 6.0 - 8.5 g/dL   Albumin 4.1 3.8 - 4.9 g/dL   Globulin, Total 2.2 1.5 - 4.5 g/dL   Bilirubin Total 0.4 0.0 - 1.2 mg/dL   Alkaline Phosphatase 94 49 - 135 IU/L   AST 16 0 - 40 IU/L   ALT 19 0 - 32 IU/L  Lipid Panel w/o Chol/HDL Ratio     Status: None   Collection Time: 08/13/24 10:22 AM  Result Value Ref Range   Cholesterol, Total 136 100 - 199 mg/dL   Triglycerides 85 0 - 149 mg/dL   HDL 51 >60 mg/dL   VLDL Cholesterol Cal 16 5 - 40 mg/dL   LDL Chol Calc (NIH) 69 0 - 99 mg/dL  CBC with Diff     Status: None   Collection Time: 08/13/24 10:22 AM  Result Value Ref Range   WBC 8.9 3.4 - 10.8 x10E3/uL   RBC 5.09 3.77 - 5.28 x10E6/uL   Hemoglobin 14.6 11.1 - 15.9 g/dL   Hematocrit 54.4 65.9 - 46.6 %   MCV 89 79 - 97 fL   MCH 28.7 26.6 - 33.0 pg   MCHC 32.1 31.5 - 35.7 g/dL   RDW 86.6 88.2 - 84.5 %   Platelets 215 150 - 450 x10E3/uL   Neutrophils 62 Not Estab. %   Lymphs 31 Not Estab. %   Monocytes 6 Not Estab. %   Eos 1 Not Estab. %   Basos 0 Not Estab. %   Neutrophils Absolute 5.4 1.4 - 7.0 x10E3/uL   Lymphocytes Absolute  2.7 0.7 - 3.1 x10E3/uL   Monocytes Absolute 0.5 0.1 - 0.9 x10E3/uL   EOS (ABSOLUTE) 0.1 0.0 - 0.4 x10E3/uL   Basophils Absolute 0.0 0.0 - 0.2 x10E3/uL   Immature Granulocytes 0 Not Estab. %   Immature Grans (Abs) 0.0 0.0 - 0.1 x10E3/uL  Hemoglobin A1c     Status: Abnormal   Collection Time: 08/13/24 10:22 AM  Result Value Ref Range   Hgb A1c MFr Bld 6.2 (H) 4.8 - 5.6 %    Comment:          Prediabetes: 5.7 - 6.4          Diabetes: >6.4          Glycemic control for adults with diabetes: <7.0    Est. average glucose Bld gHb Est-mCnc 131 mg/dL    Radiology: MR CHEST W WO CONTRAST Result Date: 07/28/2024 CLINICAL DATA:  Mass on the posterior aspect of the left chest for 1 year. EXAM: MR CHEST WITH AND WITHOUT CONTRAST TECHNIQUE: Multiplanar,  multisequence MR imaging of the chest was performed before and after the administration of intravenous contrast. CONTRAST:  10 mL Vueway  IV COMPARISON:  CT chest 05/23/2024. FINDINGS: Bones/Joint/Cartilage: Marrow signal is normal without focal lesion, fracture or evidence of infection. Ligaments: Normal. Muscles and Tendons: Normal. Soft tissue: A marker is placed over the region of concern. In the subcutaneous fatty tissue subjacent to the marker, there is an area measuring 7.2 cm craniocaudal by 2.5 cm AP by approximately 7.3 cm transverse. The lesion is T1 hyperintense with complete signal dropout on fat saturation sequences. There is no postcontrast enhancement. This could be a prominent fat lobule or unencapsulated lipoma. No worrisome mass is identified. IMPRESSION: Prominent fat lobule versus unencapsulated lipoma in the subcutaneous fatty tissue of the left chest wall. No worrisome mass is identified. Electronically Signed   By: Debby Prader M.D.   On: 07/28/2024 09:35    No results found.  No results found.    Assessment and Plan: Patient Active Problem List   Diagnosis Date Noted   Lipoma of left shoulder 08/28/2024   Left shoulder tendonitis 08/28/2024   Severe obesity with body mass index (BMI) of 36.0 to 36.9 with serious comorbidity (HCC) 08/28/2024   Acute non-recurrent maxillary sinusitis 05/15/2024   Type 2 diabetes mellitus without complication, without long-term current use of insulin (HCC) 05/15/2024   Irritable bowel syndrome with constipation 03/24/2024   CPAP use counseling 01/14/2024   Dysphagia 01/08/2024   Chronic GERD 01/08/2024   Hypertension associated with diabetes (HCC) 09/25/2023   Witnessed episode of apnea 07/16/2023   GAD (generalized anxiety disorder) 06/15/2023   Mixed hyperlipidemia 06/15/2023   Migraine without aura and without status migrainosus, not intractable 06/15/2023   Dyslipidemia associated with type 2 diabetes mellitus (HCC) 03/08/2023    Epigastric pain 03/08/2023   OSA (obstructive sleep apnea) 03/08/2023   Lymphocytosis 01/29/2022   Bilateral high frequency sensorineural hearing loss 12/08/2021   Chronic daily headache 11/10/2021   Hypertrophy of nasal turbinates 11/10/2021   Tinnitus of left ear 11/10/2021   Benign neoplasm of bronchus and lung 09/28/2015   Essential hypertension, benign 09/28/2015   Current smoker 07/02/2015   Allergic rhinitis 07/19/2007   Esophageal reflux 09/05/2006   Iron deficiency anemia 09/05/2006   1. OSA (obstructive sleep apnea)  The patient does tolerate PAP and reports  benefit from PAP use. The patient was reminded how to clean equipment and advised to replace supplies routinely. The patient was also  counselled on weight loss. The compliance is excellent. The AHI is 0.8.   OSA on cpap- controlled. Continue with excellent compliance with pap. CPAP continues to be medically necessary to treat this patient's OSA. F/u one year.    2. CPAP use counseling CPAP Counseling: had a lengthy discussion with the patient regarding the importance of PAP therapy in management of the sleep apnea. Patient appears to understand the risk factor reduction and also understands the risks associated with untreated sleep apnea. Patient will try to make a good faith effort to remain compliant with therapy. Also instructed the patient on proper cleaning of the device including the water must be changed daily if possible and use of distilled water is preferred. Patient understands that the machine should be regularly cleaned with appropriate recommended cleaning solutions that do not damage the PAP machine for example given white vinegar and water rinses. Other methods such as ozone treatment may not be as good as these simple methods to achieve cleaning.   3. Restless leg syndrome This is quite symptomatic. Penny Myers is still smoking, I did explain that it was unlikely to resolve with her continuing to smoke. Penny Myers is  working on trying to quit.  Penny Myers hasn't had iron studies since 2008 (last values were below the threshold of 75) Will check levels and if normal we can try gabapentin.   4. Iron deficiency (Primary) Will check levels - Fe+TIBC+Fer    General Counseling: I have discussed the findings of the evaluation and examination with Penny Myers.  I have also discussed any further diagnostic evaluation thatmay be needed or ordered today. Penny Myers verbalizes understanding of the findings of todays visit. We also reviewed her medications today and discussed drug interactions and side effects including but not limited excessive drowsiness and altered mental states. We also discussed that there is always a risk not just to her but also people around her. Penny Myers has been encouraged to call the office with any questions or concerns that should arise related to todays visit.  No orders of the defined types were placed in this encounter.       I have personally obtained a history, examined the patient, evaluated laboratory and imaging results, formulated the assessment and plan and placed orders. This patient was seen today by Lauraine Lay, PA-C in collaboration with Dr. Elfreda Bathe.   Elfreda DELENA Bathe, MD [facility] Diplomate ABMS Pulmonary Critical Care Medicine and Sleep Medicine

## 2024-09-29 ENCOUNTER — Telehealth: Payer: Self-pay | Admitting: Surgery

## 2024-09-29 ENCOUNTER — Ambulatory Visit (INDEPENDENT_AMBULATORY_CARE_PROVIDER_SITE_OTHER): Admitting: Internal Medicine

## 2024-09-29 VITALS — BP 116/80 | HR 84 | Resp 16 | Ht 64.0 in | Wt 212.9 lb

## 2024-09-29 DIAGNOSIS — Z7189 Other specified counseling: Secondary | ICD-10-CM | POA: Diagnosis not present

## 2024-09-29 DIAGNOSIS — G2581 Restless legs syndrome: Secondary | ICD-10-CM | POA: Diagnosis not present

## 2024-09-29 DIAGNOSIS — E611 Iron deficiency: Secondary | ICD-10-CM | POA: Diagnosis not present

## 2024-09-29 DIAGNOSIS — G4733 Obstructive sleep apnea (adult) (pediatric): Secondary | ICD-10-CM | POA: Diagnosis not present

## 2024-09-29 NOTE — Telephone Encounter (Signed)
 Patient called to cancel surgery for excision of back lipomas scheduled for 10/02/24. Did not want to reschedule, when asked why, she said will be going somewhere else. Surgery cancelled at patient's request.

## 2024-09-29 NOTE — Patient Instructions (Signed)

## 2024-09-30 ENCOUNTER — Other Ambulatory Visit

## 2024-10-01 LAB — IRON,TIBC AND FERRITIN PANEL
Ferritin: 193 ng/mL — ABNORMAL HIGH (ref 15–150)
Iron Saturation: 22 % (ref 15–55)
Iron: 57 ug/dL (ref 27–159)
Total Iron Binding Capacity: 262 ug/dL (ref 250–450)
UIBC: 205 ug/dL (ref 131–425)

## 2024-10-02 ENCOUNTER — Ambulatory Visit: Admit: 2024-10-02 | Admitting: Surgery

## 2024-10-02 SURGERY — EXCISION MASS, BACK
Anesthesia: General | Laterality: Left

## 2024-10-16 ENCOUNTER — Ambulatory Visit: Admitting: Internal Medicine

## 2024-10-20 ENCOUNTER — Other Ambulatory Visit: Payer: Self-pay | Admitting: Internal Medicine

## 2024-10-20 DIAGNOSIS — K581 Irritable bowel syndrome with constipation: Secondary | ICD-10-CM

## 2024-10-23 ENCOUNTER — Ambulatory Visit (INDEPENDENT_AMBULATORY_CARE_PROVIDER_SITE_OTHER)

## 2024-10-23 VITALS — BP 122/80 | HR 82 | Ht 64.0 in | Wt 213.0 lb

## 2024-10-23 DIAGNOSIS — E119 Type 2 diabetes mellitus without complications: Secondary | ICD-10-CM

## 2024-10-23 DIAGNOSIS — E538 Deficiency of other specified B group vitamins: Secondary | ICD-10-CM | POA: Insufficient documentation

## 2024-10-23 DIAGNOSIS — K581 Irritable bowel syndrome with constipation: Secondary | ICD-10-CM

## 2024-10-23 DIAGNOSIS — G4733 Obstructive sleep apnea (adult) (pediatric): Secondary | ICD-10-CM | POA: Diagnosis not present

## 2024-10-23 DIAGNOSIS — K219 Gastro-esophageal reflux disease without esophagitis: Secondary | ICD-10-CM

## 2024-10-23 DIAGNOSIS — I152 Hypertension secondary to endocrine disorders: Secondary | ICD-10-CM

## 2024-10-23 DIAGNOSIS — E1159 Type 2 diabetes mellitus with other circulatory complications: Secondary | ICD-10-CM

## 2024-10-23 DIAGNOSIS — E1165 Type 2 diabetes mellitus with hyperglycemia: Secondary | ICD-10-CM

## 2024-10-23 DIAGNOSIS — Z6836 Body mass index (BMI) 36.0-36.9, adult: Secondary | ICD-10-CM

## 2024-10-23 DIAGNOSIS — G5791 Unspecified mononeuropathy of right lower limb: Secondary | ICD-10-CM | POA: Diagnosis not present

## 2024-10-23 DIAGNOSIS — E1169 Type 2 diabetes mellitus with other specified complication: Secondary | ICD-10-CM

## 2024-10-23 DIAGNOSIS — E559 Vitamin D deficiency, unspecified: Secondary | ICD-10-CM | POA: Diagnosis not present

## 2024-10-23 LAB — POC CREATINE & ALBUMIN,URINE
Albumin/Creatinine Ratio, Urine, POC: <30
Creatinine, POC: 200 mg/dL
Microalbumin Ur, POC: 30 mg/L

## 2024-10-23 NOTE — Assessment & Plan Note (Signed)
-   Check labs future - Supplementation recommended based off lab results and will notify patient at that time

## 2024-10-23 NOTE — Assessment & Plan Note (Signed)
-   Reinforced healthy diet and exercise as tolerated. - Continue medications as prescribed. - Check labs in a few weeks. - Patient declined starting gabapentin at this time for neuropathy. Recommend homeopathic topical creams at patients discretion.  - Continue daily foot exams at home.

## 2024-10-23 NOTE — Progress Notes (Signed)
 "  Established Patient Office Visit  Subjective:  Patient ID: Penny Myers, female    DOB: 1971-09-02  Age: 54 y.o. MRN: [id number]  Chief Complaint  Patient presents with   Follow-up    6 week follow up    Patient is here today for her 3 months follow up.  She has been feeling fairly well since last appointment.   She does have additional concerns to discuss today: Patient reports having difficulty getting Linzess  filled at her pharmacy. Prescription recently sent again 10/21/24. Patient reports feeling constipated and currently using miralax and sienna daily without much relief. Denies abdominal pain, cramping, bloody stools.  Patient reports occasional neuropathy to right foot that is worse at night. Describes it as tingling sensation or feels as if it is getting stepped on. Patient states she has tried gabapentin in the past and did not like how it made her feel. She does not want to take any medications for it at this time. Will check B12 with upcoming labs. Foot exam performed today. Patient reports no decreased sensory response during microfilament foot exam.  Patient reports right ear felt wet after using earphones recently. Denies ear pain, decreased hearing. Endorses occasional itching. Was recently prescribed Ipratropium nasal spray. Patient states she hasn't been using it but will start using it as prescribed. She reports taking Zyrtec daily instead of Claritin.   Patient reports upcoming surgery to have lipoma removed off left shoulder blade later this month.  Labs are not due today. Future labs are ordered to be collected fasting after 11/13/24. Will collect UAC today. Patient states she was recently started on iron supplementation for low iron levels and is taking iron 3 times a week by her pulmonologist.  She does not need refills.   I have reviewed her active problem list, medication list, allergies, family history, social history, health maintenance, notes from last  encounter, lab results for her appointment today.     No other concerns at this time.   Past Medical History:  Diagnosis Date   Acid reflux    Anemia    Anxiety    Atypical chest pain 07/02/2015   Note: Unchanged Note: Unchanged   Family history of malignant neoplasm 09/28/2015   Note: Unchanged   Heart murmur    Hypertension    States that she does not have HBP, but takes losartan for leaking valve.   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HCC)    Migraine headache    Sleep apnea    APAP   Type 2 diabetes mellitus (HCC)    Wears contact lenses     Past Surgical History:  Procedure Laterality Date   APPENDECTOMY     CHOLECYSTECTOMY     ESOPHAGOGASTRODUODENOSCOPY N/A 01/08/2024   Procedure: EGD (ESOPHAGOGASTRODUODENOSCOPY);  Surgeon: Unk Corinn Skiff, MD;  Location: [facility];  Service: Endoscopy;  Laterality: N/A;   SHOULDER ARTHROSCOPY WITH SUBACROMIAL DECOMPRESSION Left 12/09/2015   Procedure: SHOULDER ARTHROSCOPY WITH SUBACROMIAL DECOMPRESSION;  Surgeon: Toribio JULIANNA Chancy, MD;  Location: [facility];  Service: Orthopedics;  Laterality: Left;   TUBAL LIGATION      Social History   Socioeconomic History   Marital status: Married    Spouse name: Not on file   Number of children: Not on file   Years of education: Not on file   Highest education level: Not on file  Occupational History   Not on file  Tobacco Use   Smoking status: Every Day    Current  packs/day: 0.30    Average packs/day: 0.3 packs/day for 37.1 years (11.1 ttl pk-yrs)    Types: Cigarettes    Start date: 1989   Smokeless tobacco: Never  Vaping Use   Vaping status: Never Used  Substance and Sexual Activity   Alcohol use: Yes    Comment: occ   Drug use: Yes    Types: Marijuana   Sexual activity: Not on file  Other Topics Concern   Not on file  Social History Narrative   Not on file   Social Drivers of Health   Tobacco Use: High Risk (10/23/2024)   Patient History     Smoking Tobacco Use: Every Day    Smokeless Tobacco Use: Never    Passive Exposure: Not on file  Financial Resource Strain: High Risk (10/02/2024)   Received from [facility]   Overall Financial Resource Strain (CARDIA)    How hard is it for you to pay for the very basics like food, housing, medical care, and heating?: Hard  Food Insecurity: Food Insecurity Present (10/02/2024)   Received from [facility]   Epic    Within the past 12 months, you worried that your food would run out before you got the money to buy more.: Often true    Within the past 12 months, the food you bought just didn't last and you didn't have money to get more.: Sometimes true  Transportation Needs: Unmet Transportation Needs (10/02/2024)   Received from [facility]    In the past 12 months, has lack of transportation kept you from medical appointments or from getting medications?: Yes    In the past 12 months, has lack of transportation kept you from meetings, work, or from getting things needed for daily living?: Yes  Physical Activity: Insufficiently Active (10/02/2024)   Received from [facility]   Exercise Vital Sign    On average, how many days per week do you engage in moderate to strenuous exercise (like a brisk walk)?: 2 days    On average, how many minutes do you engage in exercise at this level?: 30 min  Stress: Stress Concern Present (10/02/2024)   Received from [facility] of Occupational Health - Occupational Stress Questionnaire    Do you feel stress - tense, restless, nervous, or anxious, or unable to sleep at night because your mind is troubled all the time - these days?: To some extent  Social Connections: Moderately Integrated (10/02/2024)   Received from [facility]   Social Network    How would you rate your social network (family, work, friends)?: Adequate participation with social networks  Intimate Partner Violence: Not At Risk (10/02/2024)   Received from  [facility]   HITS    Over the last 12 months how often did your partner physically hurt you?: Never    Over the last 12 months how often did your partner insult you or talk down to you?: Never    Over the last 12 months how often did your partner threaten you with physical harm?: Never    Over the last 12 months how often did your partner scream or curse at you?: Never  Depression (PHQ2-9): Low Risk (09/25/2023)   Depression (PHQ2-9)    PHQ-2 Score: 2  Alcohol Screen: Not on file  Housing: Unknown (10/07/2024)   Received from [facility]   Epic    Unable to Pay for Housing in the Last Year: Not on file  In the past 12 months, how many times have you moved where you were living?: 0    Homeless in the Last Year: Not on file  Recent Concern: Housing - High Risk (10/02/2024)   Received from [facility]    In the last 12 months, was there a time when you were not able to pay the mortgage or rent on time?: Yes    Number of Times Moved in the Last Year: Not on file    At any time in the past 12 months, were you homeless or living in a shelter (including now)?: No  Utilities: At Risk (10/02/2024)   Received from [facility]    In the past 12 months has the electric, gas, oil, or water company threatened to shut off services in your home?: Yes  Health Literacy: Not on file    Family History  Problem Relation Age of Onset   Cancer Maternal Aunt    Breast cancer Maternal Aunt 64 - 49       recurred   Gastric cancer Maternal Grandmother     Allergies[1]  Review of Systems  Constitutional:  Negative for malaise/fatigue.  HENT:         Right ear wet  Eyes:  Negative for blurred vision and pain.  Respiratory:  Negative for cough and shortness of breath.   Cardiovascular:  Negative for chest pain, palpitations, claudication and leg swelling.  Gastrointestinal:  Positive for constipation. Negative for abdominal pain, blood in stool, diarrhea, nausea and vomiting.   Genitourinary:  Negative for dysuria, frequency and urgency.  Musculoskeletal: Negative.   Skin: Negative.   Neurological:  Positive for sensory change (intermittently right foot). Negative for dizziness, tingling and headaches.  Endo/Heme/Allergies: Negative.   Psychiatric/Behavioral: Negative.         Objective:   BP 122/80   Pulse 82   Ht 5' 4 (1.626 m)   Wt 213 lb (96.6 kg)   SpO2 98%   BMI 36.56 kg/m   Vitals:   10/23/24 1503  BP: 122/80  Pulse: 82  Height: 5' 4 (1.626 m)  Weight: 213 lb (96.6 kg)  SpO2: 98%  BMI (Calculated): 36.54    Physical Exam Vitals and nursing note reviewed.  Constitutional:      Appearance: Normal appearance.  HENT:     Head: Normocephalic.     Right Ear: A middle ear effusion is present. There is no impacted cerumen. Tympanic membrane is not injected.     Left Ear: A middle ear effusion is present. There is no impacted cerumen. Tympanic membrane is not injected.  Eyes:     Extraocular Movements: Extraocular movements intact.     Pupils: Pupils are equal, round, and reactive to light.  Cardiovascular:     Rate and Rhythm: Normal rate and regular rhythm.     Pulses: Normal pulses.     Heart sounds: Normal heart sounds. No murmur heard. Pulmonary:     Effort: Pulmonary effort is normal. No respiratory distress.     Breath sounds: Normal breath sounds.  Abdominal:     General: There is no distension.     Tenderness: There is no abdominal tenderness.  Musculoskeletal:        General: No tenderness. Normal range of motion.     Cervical back: Normal range of motion and neck supple.     Right lower leg: No edema.     Left lower leg: No edema.  Skin:    General: Skin is warm and dry.     Coloration: Skin is not jaundiced.     Findings: No erythema.  Neurological:     General: No focal deficit present.     Mental Status: She is alert and oriented to person, place, and time.  Psychiatric:        Mood and Affect: Mood normal.         Speech: Speech normal.        Behavior: Behavior is cooperative.        Cognition and Memory: Memory is not impaired.      Results for orders placed or performed in visit on 10/23/24  POC CREATINE & ALBUMIN,URINE  Result Value Ref Range   Microalbumin Ur, POC 30 mg/L   Creatinine, POC 200 mg/dL   Albumin/Creatinine Ratio, Urine, POC <30     Recent Results (from the past 2160 hours)  CMP14+EGFR     Status: Abnormal   Collection Time: 08/13/24 10:22 AM  Result Value Ref Range   Glucose 108 (H) 70 - 99 mg/dL   BUN 14 6 - 24 mg/dL   Creatinine, Ser 9.09 0.57 - 1.00 mg/dL   eGFR 76 >40 fO/fpw/8.26   BUN/Creatinine Ratio 16 9 - 23   Sodium 141 134 - 144 mmol/L   Potassium 4.1 3.5 - 5.2 mmol/L   Chloride 107 (H) 96 - 106 mmol/L   CO2 22 20 - 29 mmol/L   Calcium 9.5 8.7 - 10.2 mg/dL   Total Protein 6.3 6.0 - 8.5 g/dL   Albumin 4.1 3.8 - 4.9 g/dL   Globulin, Total 2.2 1.5 - 4.5 g/dL   Bilirubin Total 0.4 0.0 - 1.2 mg/dL   Alkaline Phosphatase 94 49 - 135 IU/L   AST 16 0 - 40 IU/L   ALT 19 0 - 32 IU/L  Lipid Panel w/o Chol/HDL Ratio     Status: None   Collection Time: 08/13/24 10:22 AM  Result Value Ref Range   Cholesterol, Total 136 100 - 199 mg/dL   Triglycerides 85 0 - 149 mg/dL   HDL 51 >60 mg/dL   VLDL Cholesterol Cal 16 5 - 40 mg/dL   LDL Chol Calc (NIH) 69 0 - 99 mg/dL  CBC with Diff     Status: None   Collection Time: 08/13/24 10:22 AM  Result Value Ref Range   WBC 8.9 3.4 - 10.8 x10E3/uL   RBC 5.09 3.77 - 5.28 x10E6/uL   Hemoglobin 14.6 11.1 - 15.9 g/dL   Hematocrit 54.4 65.9 - 46.6 %   MCV 89 79 - 97 fL   MCH 28.7 26.6 - 33.0 pg   MCHC 32.1 31.5 - 35.7 g/dL   RDW 86.6 88.2 - 84.5 %   Platelets 215 150 - 450 x10E3/uL   Neutrophils 62 Not Estab. %   Lymphs 31 Not Estab. %   Monocytes 6 Not Estab. %   Eos 1 Not Estab. %   Basos 0 Not Estab. %   Neutrophils Absolute 5.4 1.4 - 7.0 x10E3/uL   Lymphocytes Absolute 2.7 0.7 - 3.1 x10E3/uL   Monocytes  Absolute 0.5 0.1 - 0.9 x10E3/uL   EOS (ABSOLUTE) 0.1 0.0 - 0.4 x10E3/uL   Basophils Absolute 0.0 0.0 - 0.2 x10E3/uL   Immature Granulocytes 0 Not Estab. %   Immature Grans (Abs) 0.0 0.0 - 0.1 x10E3/uL  Hemoglobin A1c     Status: Abnormal   Collection Time: 08/13/24 10:22 AM  Result Value  Ref Range   Hgb A1c MFr Bld 6.2 (H) 4.8 - 5.6 %    Comment:          Prediabetes: 5.7 - 6.4          Diabetes: >6.4          Glycemic control for adults with diabetes: <7.0    Est. average glucose Bld gHb Est-mCnc 131 mg/dL  Fe+TIBC+Fer     Status: Abnormal   Collection Time: 09/30/24  4:07 PM  Result Value Ref Range   Total Iron Binding Capacity 262 250 - 450 ug/dL   UIBC 794 868 - 574 ug/dL   Iron 57 27 - 840 ug/dL   Iron Saturation 22 15 - 55 %   Ferritin 193 (H) 15 - 150 ng/mL  POC CREATINE & ALBUMIN,URINE     Status: Normal   Collection Time: 10/23/24  4:10 PM  Result Value Ref Range   Microalbumin Ur, POC 30 mg/L   Creatinine, POC 200 mg/dL   Albumin/Creatinine Ratio, Urine, POC <30        Assessment & Plan:   Assessment & Plan Hypertension associated with diabetes (HCC) Dyslipidemia associated with type 2 diabetes mellitus (HCC) Type 2 diabetes mellitus without complication, without long-term current use of insulin (HCC) OSA (obstructive sleep apnea) Severe obesity with body mass index (BMI) of 36.0 to 36.9 with serious comorbidity (HCC) Gastroesophageal reflux disease without esophagitis Neuropathy of foot, right - Reinforced healthy diet and exercise as tolerated. - Continue medications as prescribed. - Check labs in a few weeks. - Patient declined starting gabapentin at this time for neuropathy. Recommend homeopathic topical creams at patients discretion.  - Continue daily foot exams at home. Vitamin D deficiency B12 deficiency - Check labs future - Supplementation recommended based off lab results and will notify patient at that time  Irritable bowel syndrome with  constipation - Linzess  should be available at [facility]. Active prescription and refills available. - Reinforced healthy diet, exercise and drinking plenty of water daily.    Return in about 3 months (around 01/20/2025).   Total time spent: 30 minutes  Oddis DELENA Cain, FNP  10/23/2024   This document may have been prepared by [facility] Voice Recognition software and as such may include unintentional dictation errors.     [1] No Known Allergies  "

## 2024-10-23 NOTE — Assessment & Plan Note (Signed)
-   Linzess  should be available at [facility]. Active prescription and refills available. - Reinforced healthy diet, exercise and drinking plenty of water daily.

## 2025-01-20 ENCOUNTER — Ambulatory Visit: Admitting: Internal Medicine
